# Patient Record
Sex: Male | Born: 1970 | Race: White | Hispanic: No | Marital: Married | Smoking: Former smoker
Health system: Southern US, Community
[De-identification: ages and names within clinical notes are randomized; demographics above are authoritative.]

## PROBLEM LIST (undated history)

## (undated) DIAGNOSIS — J45909 Unspecified asthma, uncomplicated: Secondary | ICD-10-CM

## (undated) HISTORY — PX: HERNIA REPAIR: SHX51

## (undated) HISTORY — DX: Unspecified asthma, uncomplicated: J45.909

---

## 2011-10-31 ENCOUNTER — Emergency Department
Admission: EM | Admit: 2011-10-31 | Discharge: 2011-10-31 | Disposition: A | Discharge status: Home / Self Care | Payer: BC Managed Care – PPO | Source: Home / Self Care | Attending: Emergency Medicine | Admitting: Emergency Medicine

## 2011-10-31 DIAGNOSIS — K047 Periapical abscess without sinus: Secondary | ICD-10-CM

## 2011-10-31 DIAGNOSIS — K089 Disorder of teeth and supporting structures, unspecified: Secondary | ICD-10-CM

## 2011-10-31 DIAGNOSIS — K0889 Other specified disorders of teeth and supporting structures: Secondary | ICD-10-CM

## 2011-10-31 MED ORDER — PENICILLIN V POTASSIUM 500 MG PO TABS: 500.0000 mg | ORAL_TABLET | Freq: Four times a day (QID) | ORAL | Status: AC

## 2011-10-31 NOTE — ED Notes (Signed)
Pain to front tooth started on Friday

## 2011-10-31 NOTE — ED Provider Notes (Signed)
History     CSN: [id number]  Arrival date & time 10/31/11  1654   First MD Initiated Contact with Patient 10/31/11 1719      Chief Complaint  Patient presents with  . Dental Pain   patient is being seen today on Sunday 10/31/11.  Patient is a 41 y.o. male presenting with tooth pain. The history is provided by the patient.  Dental PainThe primary symptoms include mouth pain (Right upper tooth). Primary symptoms do not include dental injury, oral bleeding, headaches, fever, shortness of breath, sore throat, angioedema or cough. The symptoms began 2 days ago. The symptoms are worsening. The symptoms are new (But he had a similar dental infection years ago, and Pen-Vee K worked great for this. He requests Pen-Vee K. He states he is not requesting any prescription for controlled pain medication.). The symptoms occur constantly.  Additional symptoms include: dental sensitivity to temperature and gum tenderness. Additional symptoms do not include: gum swelling, purulent gums, trismus, facial swelling, trouble swallowing, drooling and ear pain. Medical issues do not include: cancer. Associated medical issues comments: Has not seen the dentist in a few years..   He denies any history of acute injury, but he noted the pain while chewing on food 2 days ago.  History reviewed. No pertinent past medical history.  History reviewed. No pertinent past surgical history.  Family History  Problem Relation Age of Onset  . Cancer Father     History  Substance Use Topics  . Smoking status: Not on file  . Smokeless tobacco: Not on file  . Alcohol Use:       Review of Systems  Constitutional: Negative for fever.  HENT: Negative for ear pain, sore throat, facial swelling, drooling and trouble swallowing.   Respiratory: Negative for cough and shortness of breath.   Neurological: Negative for headaches.  All other systems reviewed and are negative.    Allergies  Shrimp  Home Medications    Current Outpatient Rx  Name Route Sig Dispense Refill  . PENICILLIN V POTASSIUM 500 MG PO TABS Oral Take 1 tablet (500 mg total) by mouth 4 (four) times daily. 40 tablet 0    BP 124/78  Pulse 60  Temp 97.8 F (36.6 C) (Oral)  Resp 18  Ht 6\' 6"  (1.981 m)  Wt 270 lb (122.471 kg)  BMI 31.20 kg/m2  SpO2 97%  Physical Exam  Nursing note and vitals reviewed. Constitutional: He is oriented to person, place, and time. He appears well-developed and well-nourished. No distress.  HENT:  Head: Normocephalic and atraumatic.  Right Ear: Tympanic membrane and ear canal normal.  Left Ear: Tympanic membrane and ear canal normal.  Nose: Nose normal.  Mouth/Throat: Uvula is midline, oropharynx is clear and moist and mucous membranes are normal. Dental caries present. No lacerations.    Eyes: Conjunctivae and EOM are normal. Pupils are equal, round, and reactive to light. No scleral icterus.  Neck: Normal range of motion.  Cardiovascular: Normal rate.   Pulmonary/Chest: Effort normal.  Abdominal: He exhibits no distension.  Musculoskeletal: Normal range of motion.  Lymphadenopathy:    He has no cervical adenopathy.  Neurological: He is alert and oriented to person, place, and time.  Skin: Skin is warm.  Psychiatric: He has a normal mood and affect.    ED Course  Procedures (including critical care time)  Labs Reviewed - No data to display No results found.   1. Pain, dental   2. Abscessed tooth  MDM  For the abscessed tooth/dental pain, prescription for Pen-Vee K 500 mg by mouth 4 times a day.-He states this worked great and the past and he declined any other antibiotic. I explained to him that he needs to see a dentist ASAP, and he'll call tomorrow, Monday, to get an appointment with a dentist this week. He did not request any prescription pain medication. He prefers to continue OTC ibuprofen 600 mg 3 times a day p.c., as that helped somewhat.  We gave him printed sheet  with names and phone numbers of local dentists. I also urged him to establish with a PCP, and names and phone numbers given.        Lajean Manes, MD 10/31/11 [phone]

## 2014-05-31 ENCOUNTER — Telehealth: Payer: Self-pay | Admitting: *Deleted

## 2014-05-31 ENCOUNTER — Emergency Department
Admission: EM | Admit: 2014-05-31 | Discharge: 2014-05-31 | Disposition: A | Discharge status: Home / Self Care | Payer: BLUE CROSS/BLUE SHIELD | Source: Home / Self Care | Attending: Emergency Medicine | Admitting: Emergency Medicine

## 2014-05-31 ENCOUNTER — Encounter: Payer: Self-pay | Admitting: *Deleted

## 2014-05-31 DIAGNOSIS — J4521 Mild intermittent asthma with (acute) exacerbation: Secondary | ICD-10-CM

## 2014-05-31 MED ORDER — AZITHROMYCIN 250 MG PO TABS: ORAL_TABLET | ORAL | Status: DC

## 2014-05-31 MED ORDER — ALBUTEROL SULFATE HFA 108 (90 BASE) MCG/ACT IN AERS: 2.0000 | INHALATION_SPRAY | RESPIRATORY_TRACT | Status: DC | PRN

## 2014-05-31 MED ORDER — BENZONATATE 200 MG PO CAPS: ORAL_CAPSULE | ORAL | Status: DC

## 2014-05-31 MED ORDER — PREDNISONE (PAK) 10 MG PO TABS: ORAL_TABLET | ORAL | Status: DC

## 2014-05-31 MED ORDER — ALBUTEROL SULFATE (2.5 MG/3ML) 0.083% IN NEBU: 2.5000 mg | INHALATION_SOLUTION | RESPIRATORY_TRACT | Status: DC | PRN

## 2014-05-31 NOTE — ED Notes (Signed)
Cough x 2-3 days. HA and chest soreness.

## 2014-05-31 NOTE — ED Provider Notes (Signed)
CSN: [id number]     Arrival date & time 05/31/14  1023 History   First MD Initiated Contact with Patient 05/31/14 1037     Chief Complaint  Patient presents with  . Cough   (Consider location/radiation/quality/duration/timing/severity/associated sxs/prior Treatment) HPI URI HISTORY  Kent Hammond is a 44 y.o. male who complains of onset of chest congestion, cough, wheeze symptoms for 4 days. Primarily chest congestion and rare productive cough. Feels wheezing and chest congestion at night and used his son's albuterol nebulizer and that improved the wheezing. Also some fever chills or night sweats.  +  Fever  +  Nasal congestion minimal  Discolored Post-nasal drainage No sinus pain/pressure No sore throat  +  cough positive wheezing positive chest congestion No hemoptysis No shortness of breath No pleuritic pain  No itchy/red eyes No earache  No nausea No vomiting No abdominal pain No diarrhea  No skin rashes +  Fatigue No myalgias No headache   History reviewed. No pertinent past medical history. History reviewed. No pertinent past surgical history. Family History  Problem Relation Age of Onset  . Cancer Father    History  Substance Use Topics  . Smoking status: Former Games developermoker  . Smokeless tobacco: Never Used  . Alcohol Use: No    Review of Systems  All other systems reviewed and are negative.   Allergies  Shrimp  Home Medications   Prior to Admission medications   Medication Sig Start Date End Date Taking? Authorizing Provider  albuterol (PROVENTIL HFA;VENTOLIN HFA) 108 (90 BASE) MCG/ACT inhaler Inhale 2 puffs into the lungs every 4 (four) hours as needed for wheezing or shortness of breath. 05/31/14   Lajean Manesavid Massey, MD  albuterol (PROVENTIL) (2.5 MG/3ML) 0.083% nebulizer solution Take 3 mLs (2.5 mg total) by nebulization every 4 (four) hours as needed for wheezing. 05/31/14   Lajean Manesavid Massey, MD  azithromycin (ZITHROMAX Z-PAK) 250 MG tablet Take 2 tablets on day  one, then 1 tablet daily on days 2 through 5 05/31/14   Lajean Manesavid Massey, MD  benzonatate (TESSALON) 200 MG capsule Take 1 every 8 hours as needed for cough. 05/31/14   Lajean Manesavid Massey, MD  predniSONE (STERAPRED UNI-PAK) 10 MG tablet Take as directed for 6 days.--Take 6 on day 1, 5 on day 2, 4 on day 3, then 3 tablets on day 4, then 2 tablets on day 5, then 1 on day 6. 05/31/14   Lajean Manesavid Massey, MD   BP 112/71 mmHg  Pulse 63  Temp(Src) 98.2 F (36.8 C) (Oral)  Resp 16  Wt 274 lb (124.286 kg)  SpO2 96% Physical Exam  Constitutional: He is oriented to person, place, and time. He appears well-developed and well-nourished. No distress.  HENT:  Head: Normocephalic and atraumatic.  Right Ear: Tympanic membrane normal.  Left Ear: Tympanic membrane normal.  Nose: Nose normal.  Mouth/Throat: Oropharynx is clear and moist. No oropharyngeal exudate.  Eyes: Right eye exhibits no discharge. Left eye exhibits no discharge. No scleral icterus.  Neck: Neck supple.  Cardiovascular: Normal rate, regular rhythm and normal heart sounds.   Pulmonary/Chest: No respiratory distress. He has wheezes (Mild late expiratory wheezes throughout). He has rhonchi. He has no rales.  Lymphadenopathy:    He has no cervical adenopathy.  Neurological: He is alert and oriented to person, place, and time.  Skin: Skin is warm and dry.  Nursing note and vitals reviewed.   ED Course  Procedures (including critical care time) Labs Review Labs Reviewed - No data to display  Imaging Review No results found.   MDM   1. Asthmatic bronchitis, mild intermittent, with acute exacerbation   Treatment options discussed, as well as risks, benefits, alternatives. Patient voiced understanding and agreement with the following plans:   albuterol (PROVENTIL HFA;VENTOLIN HFA) 108 (90 BASE) MCG/ACT inhaler Inhale 2 puffs into the lungs every 4 (four) hours as needed for wheezing or shortness of breath. 18 g Lajean Manes, MD    albuterol  (PROVENTIL) (2.5 MG/3ML) 0.083% nebulizer solution Take 3 mLs (2.5 mg total) by nebulization every 4 (four) hours as needed for wheezing. 75 mL Lajean Manes, MD   azithromycin (ZITHROMAX Z-PAK) 250 MG tablet Take 2 tablets on day one, then 1 tablet daily on days 2 through 5 1 each Lajean Manes, MD   benzonatate (TESSALON) 200 MG capsule Take 1 every 8 hours as needed for cough. 20 capsule Lajean Manes, MD   predniSONE (STERAPRED UNI-PAK) 10 MG tablet Take as directed for 6 days.--Take 6 on day 1, 5 on day 2, 4 on day 3, then 3 tablets on day 4, then 2 tablets on day 5, then 1 on day 6. 21 tablet Lajean Manes, MD    Other symptomatic care discussed . Follow-up with your primary care doctor in 5-7 days if not improving, or sooner if symptoms become worse. Precautions discussed. Red flags discussed. Questions invited and answered. Patient voiced understanding and agreement.     Lajean Manes, MD 05/31/14 [phone]

## 2014-06-17 ENCOUNTER — Ambulatory Visit (INDEPENDENT_AMBULATORY_CARE_PROVIDER_SITE_OTHER): Payer: BLUE CROSS/BLUE SHIELD | Admitting: Family Medicine

## 2014-06-17 ENCOUNTER — Other Ambulatory Visit: Payer: Self-pay | Admitting: Family Medicine

## 2014-06-17 ENCOUNTER — Encounter: Payer: Self-pay | Admitting: Family Medicine

## 2014-06-17 VITALS — BP 138/90 | HR 60 | Ht 78.0 in | Wt 276.0 lb

## 2014-06-17 DIAGNOSIS — J4520 Mild intermittent asthma, uncomplicated: Secondary | ICD-10-CM | POA: Diagnosis not present

## 2014-06-17 DIAGNOSIS — L819 Disorder of pigmentation, unspecified: Secondary | ICD-10-CM | POA: Diagnosis not present

## 2014-06-17 DIAGNOSIS — J45909 Unspecified asthma, uncomplicated: Secondary | ICD-10-CM | POA: Insufficient documentation

## 2014-06-17 DIAGNOSIS — L821 Other seborrheic keratosis: Secondary | ICD-10-CM

## 2014-06-17 NOTE — Progress Notes (Signed)
CC: Kent Hammond is a 44 y.o. male is here for Establish Care   Subjective: HPI:  Very pleasant 44 year old here to establish care  Complains of a growth on the right upper chest has been present for a few years. It seems to be enlarging on a yearly basis. He occasionally gets irritated if friction is applied to it however otherwise is painless. It's been growing black lately and he and his wife are worried that something serious. No family history of skin cancers he's never had anything that required biopsy in the past. Denies unintentional weight loss fevers chills or interventions on the lesion as of yet. It's been there at least 2 or 3 years. He also has some growths on his back that are occasionally itchy that has been there for many years and are not getting bigger or worse or smaller.  History of asthmatic bronchitis. He gets a flare once every 3 years. He describes this as shortness of breath, wheezing and nonproductive cough. This recent flare was in February and responded nicely to prednisone. He never had use albuterol. Symptoms seem to be worse with sudden fluctuations of outdoor temperature. Currently he denies any cough shortness of breath wheezing or chest discomfort   Review Of Systems Outlined In HPI  Past Medical History  Diagnosis Date  . Asthmatic bronchitis     History reviewed. No pertinent past surgical history. Family History  Problem Relation Age of Onset  . Cancer Father   . Alcoholism    . Heart attack      uncles  . Hyperlipidemia Mother     History   Social History  . Marital Status: Married    Spouse Name: N/A  . Number of Children: N/A  . Years of Education: N/A   Occupational History  . Not on file.   Social History Main Topics  . Smoking status: Former Smoker    Quit date: 10/16/2005  . Smokeless tobacco: Current User    Types: Snuff  . Alcohol Use: 0.6 oz/week    1 Standard drinks or equivalent per week  . Drug Use: No  . Sexual  Activity:    Partners: Female   Other Topics Concern  . Not on file   Social History Narrative     Objective: BP 138/90 mmHg  Pulse 60  Ht  (1.981 m)  Wt 276 lb (125.193 kg)  BMI 31.90 kg/m2  General: Alert and Oriented, No Acute Distress HEENT: Pupils equal, round, reactive to light. Conjunctivae clear.  Moist mucous membranes Lungs: Clear comfortable work of breathing Cardiac: Regular rate and rhythm. Extremities: No peripheral edema.  Strong peripheral pulses.  Mental Status: No depression, anxiety, nor agitation. Skin: Warm and dry. Pedunculated fleshy and black mass on the right chest overlying the right clavicle. This lesion is approximately 0.4 cm in diameter. The stalk is completely flesh-colored without any pigmentation. Noninflamed seborrheic keratoses on the back  Assessment & Plan: Kayla was seen today for establish care.  Diagnoses and all orders for this visit:  Asthma, chronic, mild intermittent, uncomplicated Orders: -     Dermatology pathology  Pigmented skin lesion  Seborrheic keratoses   Reassurance provided regarding the benign nature of seborrheic keratoses and that the only reason that we would ever want to take these off this if they're causing him pain. He understandably does not want anything done on them today. Pigmented skin lesion of the right chest: This was removed today with a shave biopsy and sent  to pathology given the black pigmentation scattered through the mass.  Asthma: Controlled no need for interventions at this time.   Shave Biopsy Procedure Note  Pre-operative Diagnosis: Suspicious lesion  Post-operative Diagnosis: same  Locations:overlying right clavicle  Indications: Rule out melanoma  Anesthesia: None  Procedure Details   Patient informed of the risks (including bleeding and infection) and benefits of the  procedure and Verbal informed consent obtained.  The lesion and surrounding area were given a sterile  prep using chlorhexidine and draped in the usual sterile fashion. A scalpel was used to shave an area of skin approximately 0.2cm by 0.2cm.  Hemostasis achieved with alumuninum chloride. Antibiotic ointment and a sterile dressing applied.  The specimen was sent for pathologic examination. The patient tolerated the procedure well.  EBL: 3 ml  Findings: To path  Condition: Stable  Complications: none.  Plan: 1. Instructed to keep the wound dry and covered for 24-48h and clean thereafter. 2. Warning signs of infection were reviewed.   3. Recommended that the patient use OTC analgesics as needed for pain.  4. Return PRN  Return in about 6 months (around 12/18/2014).

## 2016-06-01 ENCOUNTER — Ambulatory Visit: Payer: BLUE CROSS/BLUE SHIELD | Admitting: Osteopathic Medicine

## 2016-06-03 ENCOUNTER — Ambulatory Visit (INDEPENDENT_AMBULATORY_CARE_PROVIDER_SITE_OTHER): Payer: BLUE CROSS/BLUE SHIELD | Admitting: Osteopathic Medicine

## 2016-06-03 ENCOUNTER — Encounter: Payer: Self-pay | Admitting: Osteopathic Medicine

## 2016-06-03 ENCOUNTER — Ambulatory Visit (INDEPENDENT_AMBULATORY_CARE_PROVIDER_SITE_OTHER): Payer: BLUE CROSS/BLUE SHIELD

## 2016-06-03 VITALS — BP 139/83 | HR 66 | Ht 78.0 in | Wt 303.0 lb

## 2016-06-03 DIAGNOSIS — R7301 Impaired fasting glucose: Secondary | ICD-10-CM

## 2016-06-03 DIAGNOSIS — M25571 Pain in right ankle and joints of right foot: Secondary | ICD-10-CM

## 2016-06-03 DIAGNOSIS — E669 Obesity, unspecified: Secondary | ICD-10-CM

## 2016-06-03 DIAGNOSIS — Z13220 Encounter for screening for lipoid disorders: Secondary | ICD-10-CM

## 2016-06-03 DIAGNOSIS — M7751 Other enthesopathy of right foot: Secondary | ICD-10-CM | POA: Diagnosis not present

## 2016-06-03 DIAGNOSIS — K429 Umbilical hernia without obstruction or gangrene: Secondary | ICD-10-CM

## 2016-06-03 DIAGNOSIS — L723 Sebaceous cyst: Secondary | ICD-10-CM

## 2016-06-03 NOTE — Patient Instructions (Signed)
Weight loss: important things to remember  It is hard work! You will have setbacks, but don't get discouraged. The goal is not short-term success, it is long-term health.   Looking at the numbers is important to track your progress and set goals, but how you are feeling and your overall health are the most important things! BMI and pounds and calories and miles logged aren't everything - they are tools to help us reach your goals.  You can do this!!!   Things to remember for exercise for weight loss:   Please note - I am not a certified personal trainer. I can present you with ideas and general workout goals, but an exercise program is largely up to you. Find something you can stick with, and something you enjoy!   As you progress in your exercise regimen think about gradually increasing the following, week by week:   intensity (how strenuous is your workout)  frequency (how often you are exercising)  duration (how many minutes at a time you are exercising)  Walking for 20 minutes a day is certainly better than nothing, but more strenuous exercise will develop better cardiovascular fitness.   interval training (high-intensity alternating with low-intensity, think walk/jog rather than just walk)  muscle strengthening exercises (weight lifting, calisthenics, yoga) - this also helps prevent osteoporosis!   Things to remember for diet changes for weight loss:   Please note - I am not a certified dietician. I can present you with ideas and general diet goals, but a meal plan is largely up to you. I am happy to refer you to a dietician who can give you a detailed meal plan.  Apps/logs are crucial to track how you're eating! It's not realistic to be logging everything you eat forever, but when you're starting a healthy eating lifestyle it's very helpful, and checking in with logs now and then helps you stick to your program!   Calorie restriction with the goal weight loss of no more than one  to one and a half pounds per week.   Increase lean protein such as chicken, fish, turkey.   Decrease fatty foods such as dairy, butter.   Decrease sugary foods. Avoid sugary drinks such as soda or juice.  Increase fiber found in fruit and vegetables.   

## 2016-06-03 NOTE — Progress Notes (Signed)
HPI: Kent Hammond is a 46 y.o. male  who presents to [facility] today, 06/03/16,  for chief complaint of:  Chief Complaint  Patient presents with  . Other    SWITCH FROM HOMMEL    Umbilical problem: Bellybutton is sore when touches it. There is some tissue that seems to be pushing out of it. Typically nonpainful.  Right ankle pain: Lateral below fibula, anterior to the calcaneus, bothers him when he walks too much, isn't really able to run. Otherwise, doesn't typically causing too much trouble. Occasionally will swell. Patient is not currently on or attempting to start any exercise program. History of several sprains to this ankle, most recently about 1 year ago  Skin concern: Left chest there is a lump, was previously told by PCP that this was nothing to worry about. Nonpainful, doesn't bother him, no overlying skin changes.  Obesity: Patient has made some dietary modifications to try to lose weight, has some questions about this.   Past medical history, surgical history, social history and family history reviewed.  Patient Active Problem List   Diagnosis Date Noted  . Asthma, chronic 06/17/2014  . Pigmented skin lesion 06/17/2014  . Seborrheic keratoses 06/17/2014    Current medication list and allergy/intolerance information reviewed.   Current Outpatient Prescriptions on File Prior to Visit  Medication Sig Dispense Refill  . cetirizine (ZYRTEC) 10 MG tablet Take 10 mg by mouth daily.     No current facility-administered medications on file prior to visit.    Allergies  Allergen Reactions  . Shrimp [Shellfish Allergy]       Review of Systems:  Constitutional: No recent illness  HEENT: No  headache, no vision change  Cardiac: No  chest pain, No  pressure, No palpitations  Respiratory:  No  shortness of breath. No  Cough  Gastrointestinal: No  abdominal pain, umbilical discomfort as per HPI, no change on bowel  habits  Musculoskeletal: +new myalgia/arthralgia  Skin: No  Rash, +lump as per HPI  Hem/Onc: No  easy bruising/bleeding, No  abnormal lumps/bumps  Neurologic: No  weakness, No  Dizziness  Psychiatric: No  concerns with depression, No  concerns with anxiety  Exam:  BP 139/83   Pulse 66   Ht 6\' 6"  (1.981 m)   Wt (!) 303 lb (137.4 kg)   BMI 35.02 kg/m   Constitutional: VS see above. General Appearance: alert, well-developed, well-nourished, NAD  Eyes: Normal lids and conjunctive, non-icteric sclera  Ears, Nose, Mouth, Throat: MMM, Normal external inspection ears/nares/mouth/lips/gums.  Neck: No masses, trachea midline.   Respiratory: Normal respiratory effort. no wheeze, no rhonchi, no rales  Cardiovascular: S1/S2 normal, no murmur, no rub/gallop auscultated. RRR.   Musculoskeletal: Gait normal. Symmetric and independent movement of all extremities. No pain/tenderness on ankle eversion/inversion/drawer test. Normal dorsiflexion and plantar flexion. No swelling/ecchymoses.  Neurological: Normal balance/coordination. No tremor.  Skin: warm, dry, intact. Mobile nontender mass on left chest subcutaneous, about the size of a marble, no overlying skin changes,  Psychiatric: Normal judgment/insight. Normal mood and affect. Oriented x3.    Last labs on file from [facility] in 2015   ASSESSMENT/PLAN:   Umbilical hernia without obstruction and without gangrene - Advised patient benign, options for surgical correction, advised weight loss  Right ankle tendonitis - Advised anti-inflammatory use, rest/ice/compression/elevation when it bothers him, could consider walking boot and sports medicine follow-up if no improvement.  - Plan: DG Ankle Complete Right  Sebaceous cyst - Advised benign, it does  not bother patient. Advised we can remove this if desired  Obesity (BMI 30-39.9) - Discussion of lifestyle modifications for healthy weight loss, instructions printed.  Screening, lipid -  Plan: COMPLETE METABOLIC PANEL WITH GFR, Lipid panel       Follow-up plan: Return if symptoms worsen or fail to improve, and for annual physical when due.  Visit summary with medication list and pertinent instructions was printed for patient to review, alert us if any changes needed. All questions at time of visit were answered - patient instructed to contact office with any additional concerns. ER/RTC precautions were reviewed with the patient and understanding verbalized.

## 2016-06-07 LAB — LIPID PANEL
Cholesterol: 199 mg/dL (ref <200)
HDL: 41 mg/dL (ref >40)
LDL CALC: 141 mg/dL — AB (ref <100)
Total CHOL/HDL Ratio: 4.9 Ratio (ref <5.0)
Triglycerides: 85 mg/dL (ref <150)
VLDL: 17 mg/dL (ref <30)

## 2016-06-07 LAB — COMPLETE METABOLIC PANEL WITH GFR
ALBUMIN: 4.5 g/dL (ref 3.6–5.1)
ALT: 26 U/L (ref 9–46)
AST: 18 U/L (ref 10–40)
Alkaline Phosphatase: 115 U/L (ref 40–115)
BUN: 12 mg/dL (ref 7–25)
CO2: 30 mmol/L (ref 20–31)
Calcium: 9.7 mg/dL (ref 8.6–10.3)
Chloride: 105 mmol/L (ref 98–110)
Creat: 0.9 mg/dL (ref 0.60–1.35)
GFR, Est Non African American: >89 mL/min (ref >=60)
GLUCOSE: 102 mg/dL — AB (ref 65–99)
Potassium: 5.2 mmol/L (ref 3.5–5.3)
Sodium: 142 mmol/L (ref 135–146)
Total Bilirubin: 0.8 mg/dL (ref 0.2–1.2)
Total Protein: 7 g/dL (ref 6.1–8.1)

## 2016-06-09 NOTE — Addendum Note (Signed)
Addended by: Deirdre PippinsALEXANDER, Demontae Antunes M on: 06/09/2016 09:52 AM   Modules accepted: Orders

## 2016-06-11 LAB — HEMOGLOBIN A1C

## 2016-08-16 ENCOUNTER — Ambulatory Visit (INDEPENDENT_AMBULATORY_CARE_PROVIDER_SITE_OTHER): Payer: BLUE CROSS/BLUE SHIELD | Admitting: Osteopathic Medicine

## 2016-08-16 ENCOUNTER — Encounter: Payer: Self-pay | Admitting: Osteopathic Medicine

## 2016-08-16 VITALS — BP 140/75 | HR 58 | Wt 300.0 lb

## 2016-08-16 DIAGNOSIS — R7301 Impaired fasting glucose: Secondary | ICD-10-CM

## 2016-08-16 DIAGNOSIS — E669 Obesity, unspecified: Secondary | ICD-10-CM

## 2016-08-16 LAB — POCT GLYCOSYLATED HEMOGLOBIN (HGB A1C): HEMOGLOBIN A1C: 5.4

## 2016-08-16 NOTE — Progress Notes (Signed)
HPI: Kent Hammond is a 46 y.o. male  who [facility]  today, 08/16/16,  for chief complaint of:  Chief Complaint  Patient presents with  . f/u blood sugars    check A1c since unable to add to labs     A1C 5.4 - Slightly elevated fasting glucose on routine labs prompted further evaluation. Patient has already made some dietary modifications and exercise regimen changes. Doing well since last visit  Past medical history, surgical history, social history and family history reviewed.  Patient Active Problem List   Diagnosis Date Noted  . Asthma, chronic 06/17/2014  . Pigmented skin lesion 06/17/2014  . Seborrheic keratoses 06/17/2014    Current medication list and allergy/intolerance information reviewed.   No current outpatient prescriptions on file prior to visit.   No current facility-administered medications on file prior to visit.    Allergies  Allergen Reactions  . Shrimp [Shellfish Allergy]       Review of Systems:  Constitutional: No recent illness, feels well today   Endocrine: No polyuria/polydipsia  Exam:  BP 140/75   Pulse (!) 58   Wt 300 lb (136.1 kg)   BMI 34.67 kg/m   Constitutional: VS see above. General Appearance: alert, well-developed, well-nourished, NAD  Psychiatric: Normal judgment/insight. Normal mood and affect. Oriented x3.    Recent Results (from the past 2160 hour(s))  COMPLETE METABOLIC PANEL WITH GFR     Status: Abnormal   Collection Time: 06/07/16  8:05 AM  Result Value Ref Range   Sodium 142 135 - 146 mmol/L   Potassium 5.2 3.5 - 5.3 mmol/L   Chloride 105 98 - 110 mmol/L   CO2 30 20 - 31 mmol/L   Glucose, Bld 102 (H) 65 - 99 mg/dL   BUN 12 7 - 25 mg/dL   Creat 8.650.90 7.840.60 - 6.961.35 mg/dL   Total Bilirubin 0.8 0.2 - 1.2 mg/dL   Alkaline Phosphatase 115 40 - 115 U/L   AST 18 10 - 40 U/L   ALT 26 9 - 46 U/L   Total Protein 7.0 6.1 - 8.1 g/dL   Albumin 4.5 3.6 - 5.1 g/dL   Calcium 9.7  8.6 - 29.510.3 mg/dL   GFR, Est African American >89 >=60 mL/min   GFR, Est Non African American >89 >=60 mL/min  Lipid panel     Status: Abnormal   Collection Time: 06/07/16  8:05 AM  Result Value Ref Range   Cholesterol 199 <200 mg/dL   Triglycerides 85 <284<150 mg/dL   HDL 41 >13>40 mg/dL   Total CHOL/HDL Ratio 4.9 <5.0 Ratio   VLDL 17 <30 mg/dL   LDL Cholesterol 244141 (H) <100 mg/dL  Hemoglobin [id number]     Status: None   Collection Time: 06/09/16  3:52 AM  Result Value Ref Range   Hgb A1c MFr Bld CANCELED <5.7 %    Comment: Test not performed, no Lavender was received.    Result canceled by the ancillary    Mean Plasma Glucose CANCELED mg/dL    Comment: Result canceled by the ancillary   Results for orders placed or performed in visit on 08/16/16 (from the past 24 hour(s))  POCT HgB A1C     Status: Abnormal   Collection Time: 08/16/16  9:35 AM  Result Value Ref Range   Hemoglobin A1C 5.4        ASSESSMENT/PLAN:   Doing well, reviewed all lab results as noted above.  Elevated fasting glucose - Plan:  POCT HgB A1C  Obesity (BMI 30-39.9)     Follow-up plan: Return for Recheck labs in 6-12 months, annual physical in 12 months.   All questions at time of visit were answered - patient instructed to contact office with any additional concerns. ER/RTC precautions were reviewed with the patient and understanding verbalized.   Note: Total time spent 10 minutes, greater than 50% of the visit was spent face-to-face counseling and coordinating care for the following: The primary encounter diagnosis was Elevated fasting glucose. A diagnosis of Obesity (BMI 30-39.9) was also pertinent to this visit.Marland Kitchen

## 2017-03-19 ENCOUNTER — Encounter: Payer: Self-pay | Admitting: Emergency Medicine

## 2017-03-19 ENCOUNTER — Emergency Department
Admission: EM | Admit: 2017-03-19 | Discharge: 2017-03-19 | Disposition: A | Discharge status: Home / Self Care | Payer: BLUE CROSS/BLUE SHIELD | Source: Home / Self Care | Attending: Family Medicine | Admitting: Family Medicine

## 2017-03-19 DIAGNOSIS — R519 Headache, unspecified: Secondary | ICD-10-CM

## 2017-03-19 DIAGNOSIS — R51 Headache: Secondary | ICD-10-CM

## 2017-03-19 NOTE — Discharge Instructions (Signed)
° °  You may take 500mg  acetaminophen every 4-6 hours or in combination with ibuprofen 400-600mg  every 6-8 hours as needed for pain and inflammation.   Be sure to drink at least eight 8oz glasses of water to stay well hydrated and get at least 8 hours of sleep at night, preferably more while sick or when you get a headache.

## 2017-03-19 NOTE — ED Triage Notes (Signed)
Patient complaining of severe headache last night, unable to go to work, nausea, took some Advil with no relief.  Patient feels better today.

## 2017-03-19 NOTE — ED Provider Notes (Signed)
[facility]    CSN: [id number] Arrival date & time: 03/19/17  1100     History   Chief Complaint Chief Complaint  Patient presents with  . Headache    HPI Kent Hammond is a 46 y.o. male.   HPI  Kent Hammond is a 46 y.o. 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 He had to call out sick.  Denies change in vision but did have some nausea with it.  Denies weakness or numbness in arms or legs.  Pain was moderate to severe, radiating to Right side of neck.  Pain has nearly resolved after taking Advil and going sleeping last night.  He is requesting a work note today.  Denies hx of migraines or severe headaches. No recent head trauma, decreased sleep, illness, or increased stress.  He does report shoveling snow a few days ago and has some Left sided neck/shoulder stiffness but not on the Right side. Ice and heat have helped with the muscle soreness.    Past Medical History:  Diagnosis Date  . Asthmatic bronchitis     Patient Active Problem List   Diagnosis Date Noted  . Asthma, chronic 06/17/2014  . Pigmented skin lesion 06/17/2014  . Seborrheic keratoses 06/17/2014    History reviewed. No pertinent surgical history.     Home Medications    Prior to Admission medications   Not on File    Family History Family History  Problem Relation Age of Onset  . Cancer Father   . Alcoholism Unknown   . Heart attack Unknown        uncles  . Hyperlipidemia Mother     Social History Social History   Tobacco Use  . Smoking status: Former Smoker    Last attempt to quit: 10/16/2005    Years since quitting: 11.4  . Smokeless tobacco: Current User    Types: Snuff  Substance Use Topics  . Alcohol use: Yes    Alcohol/week: 0.6 oz    Types: 1 Standard drinks or equivalent per week  . Drug use: No     Allergies   Shrimp [shellfish allergy]   Review of Systems Review  of Systems  Constitutional: Negative for chills and fever.  Eyes: Negative for photophobia, pain and visual disturbance.  Gastrointestinal: Positive for nausea. Negative for diarrhea and vomiting.  Musculoskeletal: Positive for back pain (Left upper), myalgias, neck pain and neck stiffness (Left side, has since resolved).  Skin: Negative for rash and wound.  Neurological: Positive for headaches. Negative for dizziness, tremors, seizures, syncope, facial asymmetry, speech difficulty, weakness, light-headedness and numbness.     Physical Exam Triage Vital Signs ED Triage Vitals [03/19/17 1127]  Enc Vitals Group     BP 138/90     Pulse Rate 66     Resp      Temp 98.4 F (36.9 C)     Temp Source Oral     SpO2 97 %     Weight (!) 306 lb 8 oz (139 kg)     Height 6\' 6"  (1.981 m)     Head Circumference      Peak Flow      Pain Score 0     Pain Loc      Pain Edu?      Excl. in GC?    No data found.  Updated Vital Signs BP 138/90 (BP Location: Right Arm)  Pulse 66   Temp 98.4 F (36.9 C) (Oral)   Ht 6\' 6"  (1.981 m)   Wt (!) 306 lb 8 oz (139 kg)   SpO2 97%   BMI 35.42 kg/m   Visual Acuity Right Eye Distance:   Left Eye Distance:   Bilateral Distance:    Right Eye Near:   Left Eye Near:    Bilateral Near:     Physical Exam  Constitutional: He is oriented to person, place, and time. He appears well-developed and well-nourished.  Non-toxic appearance. He does not appear ill. No distress.  HENT:  Head: Normocephalic and atraumatic.  Right Ear: Tympanic membrane normal.  Left Ear: Tympanic membrane normal.  Nose: Nose normal. Right sinus exhibits no maxillary sinus tenderness and no frontal sinus tenderness. Left sinus exhibits no maxillary sinus tenderness and no frontal sinus tenderness.  Mouth/Throat: Uvula is midline, oropharynx is clear and moist and mucous membranes are normal.  No tenderness over temporal artery  Eyes: Conjunctivae and EOM are normal. Pupils are  equal, round, and reactive to light. No scleral icterus.  Neck: Normal range of motion.  Cardiovascular: Normal rate, regular rhythm and normal heart sounds.  Pulmonary/Chest: Effort normal and breath sounds normal. No respiratory distress. He has no wheezes. He has no rales. He exhibits no tenderness.  Abdominal: Soft. Bowel sounds are normal. He exhibits no distension and no mass. There is no tenderness. There is no rebound and no guarding.  Musculoskeletal: Normal range of motion.  Neurological: He is alert and oriented to person, place, and time. He has normal strength. No cranial nerve deficit. He displays a negative Romberg sign.  Skin: Skin is warm and dry.  Nursing note and vitals reviewed.    UC Treatments / Results  Labs (all labs ordered are listed, but only abnormal results are displayed) Labs Reviewed - No data to display  EKG  EKG Interpretation None       Radiology No results found.  Procedures Procedures (including critical care time)  Medications Ordered in UC Medications - No data to display   Initial Impression / Assessment and Plan / UC Course  I have reviewed the triage vital signs and the nursing notes.  Pertinent labs & imaging results that were available during my care of the patient were reviewed by me and considered in my medical decision making (see chart for details).     Hx and exam most c/w tension type headache. Reassuring symptoms have nearly resolved this morning and normal neuro exam. Encouraged fluids and rest today. Pt scheduled to return to work on Tuesday, 12/18. Encouraged f/u with PCP Monday or Tuesday if HAs keep returning or worsening.  Discussed symptoms that warrant emergent care in the ED.   Final Clinical Impressions(s) / UC Diagnoses   Final diagnoses:  Right-sided headache    ED Discharge Orders    None       Controlled Substance Prescriptions [place] Controlled Substance Registry consulted? Not Applicable     Rolla Platehelps, Andilyn Bettcher O, PA-C 03/19/17 1239

## 2017-04-23 ENCOUNTER — Emergency Department (INDEPENDENT_AMBULATORY_CARE_PROVIDER_SITE_OTHER): Payer: BLUE CROSS/BLUE SHIELD

## 2017-04-23 ENCOUNTER — Encounter: Payer: Self-pay | Admitting: Emergency Medicine

## 2017-04-23 ENCOUNTER — Emergency Department
Admission: EM | Admit: 2017-04-23 | Discharge: 2017-04-23 | Disposition: A | Discharge status: Home / Self Care | Payer: BLUE CROSS/BLUE SHIELD | Source: Home / Self Care | Attending: Family Medicine | Admitting: Family Medicine

## 2017-04-23 ENCOUNTER — Other Ambulatory Visit: Payer: Self-pay

## 2017-04-23 DIAGNOSIS — R05 Cough: Secondary | ICD-10-CM

## 2017-04-23 DIAGNOSIS — J069 Acute upper respiratory infection, unspecified: Secondary | ICD-10-CM

## 2017-04-23 LAB — POCT INFLUENZA A/B
INFLUENZA A, POC: NEGATIVE
Influenza B, POC: NEGATIVE

## 2017-04-23 MED ORDER — BENZONATATE 200 MG PO CAPS: ORAL_CAPSULE | ORAL | 0 refills | Status: DC

## 2017-04-23 MED ORDER — PREDNISONE 20 MG PO TABS: ORAL_TABLET | ORAL | 0 refills | Status: DC

## 2017-04-23 MED ORDER — AZITHROMYCIN 250 MG PO TABS: ORAL_TABLET | ORAL | 0 refills | Status: DC

## 2017-04-23 MED ORDER — ALBUTEROL SULFATE (2.5 MG/3ML) 0.083% IN NEBU: 2.5000 mg | INHALATION_SOLUTION | Freq: Four times a day (QID) | RESPIRATORY_TRACT | 0 refills | Status: DC | PRN

## 2017-04-23 NOTE — Discharge Instructions (Signed)
Take plain guaifenesin (1200mg  extended release tabs such as Mucinex) twice daily, with plenty of water, for cough and congestion.  May add Pseudoephedrine (30mg , one or two every 4 to 6 hours) for sinus congestion.  Get adequate rest.   May use Afrin nasal spray (or generic oxymetazoline) each morning for about 5 days and then discontinue.  Also recommend using saline nasal spray several times daily and saline nasal irrigation (AYR is a common brand).  Use Flonase nasal spray each morning after using Afrin nasal spray and saline nasal irrigation. Try warm salt water gargles for sore throat.  Stop all antihistamines for now, and other non-prescription cough/cold preparations. Begin albuterol by nebulizer if needed for wheezing and shortness of breath. Begin Tessalon at night if necessary for night cough.

## 2017-04-23 NOTE — ED Triage Notes (Signed)
Reports 3 days of congeston, tinnitus, cough; took NyQuil 0300.

## 2017-04-23 NOTE — ED Provider Notes (Signed)
[name]    CSN: [id number] Arrival date & time: 04/23/17  1036     History   Chief Complaint Chief Complaint  Patient presents with  . Nasal Congestion  . Cough  . Tinnitus    HPI Kent Hammond is a 47 y.o. male.   Patient complains of five day history of typical cold-like symptoms developing over several days, including mild sore throat, sinus congestion, hoarse, fatigue, and cough.  This morning he developed tightness in his anterior chest, ear pressure, and tinnitus.  He has a past history of asthma as infant, seasonal rhinitis, and pneumonia two years ago.   The history is provided by the patient.    Past Medical History:  Diagnosis Date  . Asthmatic bronchitis     Patient Active Problem List   Diagnosis Date Noted  . Asthma, chronic 06/17/2014  . Pigmented skin lesion 06/17/2014  . Seborrheic keratoses 06/17/2014    History reviewed. No pertinent surgical history.     Home Medications    Prior to Admission medications   Medication Sig Start Date End Date Taking? Authorizing Provider  albuterol (PROVENTIL) (2.5 MG/3ML) 0.083% nebulizer solution Take 3 mLs (2.5 mg total) by nebulization every 6 (six) hours as needed for wheezing or shortness of breath. Max 4 doses per day 04/23/17   Kent Haw, MD  azithromycin (ZITHROMAX Z-PAK) 250 MG tablet Take 2 tabs today; then begin one tab once daily for 4 more days. 04/23/17   Kent Haw, MD  benzonatate (TESSALON) 200 MG capsule Take one cap by mouth at bedtime as needed for cough.  May repeat in 4 to 6 hours 04/23/17   Kent Haw, MD  predniSONE (DELTASONE) 20 MG tablet Take one tab by mouth twice daily for 4 days, then one daily for 3 days. Take with food. 04/23/17   Kent Haw, MD    Family History Family History  Problem Relation Age of Onset  . Cancer Father   . Alcoholism Unknown   . Heart attack Unknown        uncles  . Hyperlipidemia Mother     Social  History Social History   Tobacco Use  . Smoking status: Former Smoker    Last attempt to quit: 10/16/2005    Years since quitting: 11.5  . Smokeless tobacco: Current User    Types: Snuff  Substance Use Topics  . Alcohol use: Yes    Alcohol/week: 0.6 oz    Types: 1 Standard drinks or equivalent per week  . Drug use: No     Allergies   Shrimp [shellfish allergy]   Review of Systems Review of Systems + sore throat + cough + hoarse + sneezing No pleuritic pain No wheezing + nasal congestion + post-nasal drainage No sinus pain/pressure No itchy/red eyes ? Earache + tinnitus No hemoptysis No SOB No fever/chills No nausea No vomiting No abdominal pain No diarrhea No urinary symptoms No skin rash + fatigue No myalgias + headache Used OTC meds without relief   Physical Exam Triage Vital Signs ED Triage Vitals  Enc Vitals Group     BP 04/23/17 1113 (!) 143/83     Pulse Rate 04/23/17 1113 78     Resp 04/23/17 1113 18     Temp 04/23/17 1113 97.8 F (36.6 C)     Temp Source 04/23/17 1113 Oral     SpO2 04/23/17 1113 97 %     Weight 04/23/17 1114 (!) 303 lb (137.4  kg)     Height 04/23/17 1114 6\' 6"  (1.981 m)     Head Circumference --      Peak Flow --      Pain Score --      Pain Loc --      Pain Edu? --      Excl. in GC? --    No data found.  Updated Vital Signs BP (!) 143/83 (BP Location: Right Arm)   Pulse 78   Temp 97.8 F (36.6 C) (Oral)   Resp 18   Ht 6\' 6"  (1.981 m)   Wt (!) 303 lb (137.4 kg)   SpO2 97%   BMI 35.02 kg/m   Visual Acuity Right Eye Distance:   Left Eye Distance:   Bilateral Distance:    Right Eye Near:   Left Eye Near:    Bilateral Near:     Physical Exam Nursing notes and Vital Signs reviewed. Appearance:  Patient appears stated age, and in no acute distress Eyes:  Pupils are equal, round, and reactive to light and accomodation.  Extraocular movement is intact.  Conjunctivae are not inflamed  Ears:  Canals normal.   Tympanic membranes normal.  Nose:  Mildly congested turbinates.  No sinus tenderness.  Pharynx:  Normal Neck:  Supple.  Enlarged posterior/lateral nodes are palpated bilaterally, tender to palpation on the left.   Lungs:  Clear to auscultation.  Breath sounds are equal.  Moving air well. Heart:  Regular rate and rhythm without murmurs, rubs, or gallops.  Abdomen:  Nontender without masses or hepatosplenomegaly.  Bowel sounds are present.  No CVA or flank tenderness.  Extremities:  No edema.  Skin:  No rash present.    UC Treatments / Results  Labs (all labs ordered are listed, but only abnormal results are displayed) Labs Reviewed  POCT INFLUENZA A/B negative    EKG  EKG Interpretation None       Radiology Dg Chest 2 View  Result Date: 04/23/2017 CLINICAL DATA:  Cough and congestion for several days EXAM: CHEST  2 VIEW COMPARISON:  None. FINDINGS: There is no appreciable edema or consolidation. Heart size and pulmonary vascularity are normal. No adenopathy. No pneumothorax. No bone lesions. IMPRESSION: No edema or consolidation. Electronically Signed   By: Bretta Bang III M.D.   On: 04/23/2017 11:46    Procedures Procedures (including critical care time)  Medications Ordered in UC Medications - No data to display   Initial Impression / Assessment and Plan / UC Course  I have reviewed the triage vital signs and the nursing notes.  Pertinent labs & imaging results that were available during my care of the patient were reviewed by me and considered in my medical decision making (see chart for details).    Begin Z-pak for atypical coverage, and prednisone burst/taper. Prescription written for Benzonatate [facility]) to take at bedtime for night-time cough.  Rx for albuterol solution for nebulizer if needed. Take plain guaifenesin (1200mg  extended release tabs such as Mucinex) twice daily, with plenty of water, for cough and congestion.  May add Pseudoephedrine (30mg , one  or two every 4 to 6 hours) for sinus congestion.  Get adequate rest.   May use Afrin nasal spray (or generic oxymetazoline) each morning for about 5 days and then discontinue.  Also recommend using saline nasal spray several times daily and saline nasal irrigation (AYR is a common brand).  Use Flonase nasal spray each morning after using Afrin nasal spray and saline nasal irrigation.  Try warm salt water gargles for sore throat.  Stop all antihistamines for now, and other non-prescription cough/cold preparations. Begin albuterol by nebulizer if needed for wheezing and shortness of breath. Begin Tessalon at night if necessary for night cough. Followup with Family Doctor if not improved in about 10 days.    Final Clinical Impressions(s) / UC Diagnoses   Final diagnoses:  Upper respiratory tract infection, unspecified type    ED Discharge Orders        Ordered    azithromycin (ZITHROMAX Z-PAK) 250 MG tablet     04/23/17 1208    predniSONE (DELTASONE) 20 MG tablet     04/23/17 1208    benzonatate (TESSALON) 200 MG capsule     04/23/17 1209    albuterol (PROVENTIL) (2.5 MG/3ML) 0.083% nebulizer solution  Every 6 hours PRN     04/23/17 1209          Kent HawBeese, Frady Taddeo A, MD 04/29/17 1459

## 2017-05-13 ENCOUNTER — Telehealth: Payer: Self-pay | Admitting: Physician Assistant

## 2017-05-13 DIAGNOSIS — Z20828 Contact with and (suspected) exposure to other viral communicable diseases: Secondary | ICD-10-CM

## 2017-05-13 MED ORDER — OSELTAMIVIR PHOSPHATE 75 MG PO CAPS: 75.0000 mg | ORAL_CAPSULE | Freq: Every day | ORAL | 0 refills | Status: AC

## 2017-05-13 NOTE — Telephone Encounter (Signed)
Daughter diagnosed with influenza A in office today He is a household contact Lab Results  Component Value Date   CREATININE 0.90 06/07/2016   BUN 12 06/07/2016   NA 142 06/07/2016   K 5.2 06/07/2016   CL 105 06/07/2016   CO2 30 06/07/2016   1. Exposure to the flu - no hx of renal impairment - oseltamivir (TAMIFLU) 75 MG capsule; Take 1 capsule (75 mg total) by mouth daily for 10 days.  Dispense: 10 capsule; Refill: 0

## 2017-08-15 ENCOUNTER — Encounter: Payer: BLUE CROSS/BLUE SHIELD | Admitting: Osteopathic Medicine

## 2017-09-08 ENCOUNTER — Encounter: Payer: Self-pay | Admitting: Emergency Medicine

## 2017-09-08 ENCOUNTER — Emergency Department
Admission: EM | Admit: 2017-09-08 | Discharge: 2017-09-08 | Disposition: A | Discharge status: Home / Self Care | Payer: BLUE CROSS/BLUE SHIELD | Source: Home / Self Care | Attending: Family Medicine | Admitting: Family Medicine

## 2017-09-08 ENCOUNTER — Other Ambulatory Visit: Payer: Self-pay

## 2017-09-08 DIAGNOSIS — K0889 Other specified disorders of teeth and supporting structures: Secondary | ICD-10-CM

## 2017-09-08 DIAGNOSIS — J340 Abscess, furuncle and carbuncle of nose: Secondary | ICD-10-CM

## 2017-09-08 MED ORDER — AMOXICILLIN-POT CLAVULANATE 875-125 MG PO TABS: 1.0000 | ORAL_TABLET | Freq: Two times a day (BID) | ORAL | 0 refills | Status: DC

## 2017-09-08 NOTE — ED Triage Notes (Signed)
Upper Front Left tooth pain x 3 days

## 2017-09-08 NOTE — ED Provider Notes (Signed)
[facility]    CSN: [id number] Arrival date & time: 09/08/17  1028     History   Chief Complaint Chief Complaint  Patient presents with  . Dental Pain    HPI Kent Hammond is a 47 y.o. male.   HPI  Kent Hammond is a 47 y.o. male presenting to UC with c/o 3 days of gradually worsening dental pain that is aching and throbbing. Pt has partial dentures in the location of pain but notes he did chip one of his partials 2 weeks ago and wonders if that could have started everything. He is having soreness and redness on the Left side of his nose above where the tooth pain is. No fever or chills. He does have a dentist but knows they would direct him to UC first to be treated for infection before dental work is done.   Past Medical History:  Diagnosis Date  . Asthmatic bronchitis     Patient Active Problem List   Diagnosis Date Noted  . Asthma, chronic 06/17/2014  . Pigmented skin lesion 06/17/2014  . Seborrheic keratoses 06/17/2014    History reviewed. No pertinent surgical history.     Home Medications    Prior to Admission medications   Medication Sig Start Date End Date Taking? Authorizing Provider  amoxicillin-clavulanate (AUGMENTIN) 875-125 MG tablet Take 1 tablet by mouth 2 (two) times daily. One po bid x 7 days 09/08/17   Lurene Shadow, PA-C    Family History Family History  Problem Relation Age of Onset  . Cancer Father   . Alcoholism Unknown   . Heart attack Unknown        uncles  . Hyperlipidemia Mother     Social History Social History   Tobacco Use  . Smoking status: Former Smoker    Last attempt to quit: 10/16/2005    Years since quitting: 11.9  . Smokeless tobacco: Current User    Types: Snuff  Substance Use Topics  . Alcohol use: Yes    Alcohol/week: 0.6 oz    Types: 1 Standard drinks or equivalent per week  . Drug use: No     Allergies   Shrimp [shellfish allergy]   Review of Systems Review of Systems    Constitutional: Negative for chills and fever.  HENT: Positive for dental problem. Negative for congestion, sinus pressure, sinus pain, sore throat and trouble swallowing.   Skin: Positive for color change. Negative for wound.     Physical Exam Triage Vital Signs ED Triage Vitals  Enc Vitals Group     BP 09/08/17 1051 133/86     Pulse Rate 09/08/17 1051 62     Resp --      Temp 09/08/17 1051 (!) 97.5 F (36.4 C)     Temp Source 09/08/17 1051 Oral     SpO2 09/08/17 1051 96 %     Weight 09/08/17 1052 (!) 313 lb (142 kg)     Height 09/08/17 1052 6\' 6"  (1.981 m)     Head Circumference --      Peak Flow --      Pain Score 09/08/17 1052 4     Pain Loc --      Pain Edu? --      Excl. in GC? --    No data found.  Updated Vital Signs BP 133/86 (BP Location: Right Arm)   Pulse 62   Temp (!) 97.5 F (36.4 C) (Oral)   Ht 6\' 6"  (1.981 m)  Wt (!) 313 lb (142 kg)   SpO2 96%   BMI 36.17 kg/m   Visual Acuity Right Eye Distance:   Left Eye Distance:   Bilateral Distance:    Right Eye Near:   Left Eye Near:    Bilateral Near:     Physical Exam  Constitutional: He is oriented to person, place, and time. He appears well-developed and well-nourished.  HENT:  Head: Normocephalic and atraumatic.  Nose: Sinus tenderness present. No mucosal edema. Right sinus exhibits no maxillary sinus tenderness and no frontal sinus tenderness. Left sinus exhibits no maxillary sinus tenderness and no frontal sinus tenderness.    Mouth/Throat: Uvula is midline, oropharynx is clear and moist and mucous membranes are normal.  Tenderness along front upper gingiva on the Left side. No edema, bleeding or discharge.   Eyes: EOM are normal.  Neck: Normal range of motion.  Cardiovascular: Normal rate.  Pulmonary/Chest: Effort normal.  Musculoskeletal: Normal range of motion.  Neurological: He is alert and oriented to person, place, and time.  Skin: Skin is warm and dry.  Psychiatric: He has a normal  mood and affect. His behavior is normal.  Nursing note and vitals reviewed.    UC Treatments / Results  Labs (all labs ordered are listed, but only abnormal results are displayed) Labs Reviewed - No data to display  EKG None  Radiology No results found.  Procedures Procedures (including critical care time)  Medications Ordered in UC Medications - No data to display  Initial Impression / Assessment and Plan / UC Course  I have reviewed the triage vital signs and the nursing notes.  Pertinent labs & imaging results that were available during my care of the patient were reviewed by me and considered in my medical decision making (see chart for details).     Concern for underlying dental abscess and early cellulitis of Left side of nose.  Will start on Augmentin   Final Clinical Impressions(s) / UC Diagnoses   Final diagnoses:  Toothache  Cellulitis of nose     Discharge Instructions      Please take antibiotics as prescribed and be sure to complete entire course even if you start to feel better to ensure infection does not come back.  You may take 500mg  acetaminophen every 4-6 hours or in combination with ibuprofen 400-600mg  every 6-8 hours as needed for pain and inflammation.  Please follow up with your family doctor if your nose keeps becoming more sore and follow up with your dentist for further evaluation of dental pain.      ED Prescriptions    Medication Sig Dispense Auth. Provider   amoxicillin-clavulanate (AUGMENTIN) 875-125 MG tablet Take 1 tablet by mouth 2 (two) times daily. One po bid x 7 days 14 tablet Lurene ShadowPhelps, Anahli Arvanitis O, [place]PA-C     Controlled Substance Prescriptions [place] Controlled Substance Registry consulted? Not Applicable   Rolla Platehelps, Syre Knerr O, PA-C 09/08/17 1105

## 2017-09-08 NOTE — Discharge Instructions (Signed)
°  Please take antibiotics as prescribed and be sure to complete entire course even if you start to feel better to ensure infection does not come back.  You may take 500mg  acetaminophen every 4-6 hours or in combination with ibuprofen 400-600mg  every 6-8 hours as needed for pain and inflammation.  Please follow up with your family doctor if your nose keeps becoming more sore and follow up with your dentist for further evaluation of dental pain.

## 2017-09-19 ENCOUNTER — Encounter: Payer: Self-pay | Admitting: Osteopathic Medicine

## 2017-09-19 ENCOUNTER — Ambulatory Visit (INDEPENDENT_AMBULATORY_CARE_PROVIDER_SITE_OTHER): Payer: BLUE CROSS/BLUE SHIELD | Admitting: Osteopathic Medicine

## 2017-09-19 VITALS — BP 130/80 | HR 66 | Temp 97.9°F | Ht 78.0 in | Wt 311.0 lb

## 2017-09-19 DIAGNOSIS — K429 Umbilical hernia without obstruction or gangrene: Secondary | ICD-10-CM

## 2017-09-19 DIAGNOSIS — Z0000 Encounter for general adult medical examination without abnormal findings: Secondary | ICD-10-CM

## 2017-09-19 DIAGNOSIS — R7301 Impaired fasting glucose: Secondary | ICD-10-CM | POA: Diagnosis not present

## 2017-09-19 DIAGNOSIS — Z23 Encounter for immunization: Secondary | ICD-10-CM | POA: Diagnosis not present

## 2017-09-19 MED ORDER — ALBUTEROL SULFATE HFA 108 (90 BASE) MCG/ACT IN AERS
2.0000 | INHALATION_SPRAY | RESPIRATORY_TRACT | 11 refills | Status: DC | PRN
Start: 2017-09-19 — End: 2018-03-01

## 2017-09-19 NOTE — Progress Notes (Signed)
HPI: Kent Hammond is a 47 y.o. male who  has a past medical history of Asthmatic bronchitis.  he presents to [facility] today, 09/19/17,  for chief complaint of: Annual Physical   Patient here for annual physical / wellness exam.  See preventive care reviewed as below.  Recent labs reviewed   Additional concerns today include:   Intentional weight loss over the past year, overall doing well w/ diet/exercise.   Hx asthma: stable, no problems, hasn't needed meds. Inhaler probably old  Requests referral to gen surg for discussion on possible repair of umbilical hernia   Hx elevated Glc, last A1C was ok   Patient is accompanied by wife who assists with history-taking.   Past medical, surgical, social and family history reviewed:  Patient Active Problem List   Diagnosis Date Noted  . Umbilical hernia without obstruction and without gangrene 09/19/2017  . Asthma, chronic 06/17/2014  . Pigmented skin lesion 06/17/2014  . Seborrheic keratoses 06/17/2014   No past surgical history on file.  Social History   Tobacco Use  . Smoking status: Former Smoker    Last attempt to quit: 10/16/2005    Years since quitting: 11.9  . Smokeless tobacco: Current User    Types: Snuff  Substance Use Topics  . Alcohol use: Yes    Alcohol/week: 0.6 oz    Types: 1 Standard drinks or equivalent per week    Family History  Problem Relation Age of Onset  . Cancer Father        brain cancer, unsure if complication from [place] War   . Alcoholism Unknown   . Heart attack Unknown        uncles  . Hyperlipidemia Mother      Current medication list and allergy/intolerance information reviewed:    Current Outpatient Medications  Medication Sig Dispense Refill  . albuterol (PROVENTIL HFA;VENTOLIN HFA) 108 (90 Base) MCG/ACT inhaler Inhale 2 puffs into the lungs every 4 (four) hours as needed for wheezing or shortness of breath. 2 Inhaler 11   No current  facility-administered medications for this visit.     Allergies  Allergen Reactions  . Shrimp [Shellfish Allergy]       Review of Systems:  Constitutional:  No  fever, no chills, No recent illness, No unintentional weight changes. No significant fatigue.   HEENT: No  headache, no vision change, no hearing change, No sore throat, No  sinus pressure  Cardiac: No  chest pain, No  pressure, No palpitations, no exercise intolerance  Respiratory:  No  shortness of breath. No  Cough  Gastrointestinal: No  abdominal pain, No  nausea, No  vomiting,  No  blood in stool, No  diarrhea, No  constipation   Musculoskeletal: No new myalgia/arthralgia  Skin: No  Rash, No other wounds/concerning lesions  Genitourinary: No urinary concerns   Hem/Onc: No  easy bruising/bleeding,  Endocrine: No cold intolerance,  No heat intolerance. No polyuria/polydipsia/polyphagia   Neurologic: No  weakness, No  dizziness  Psychiatric: No  concerns with depression, No  concerns with anxiety, No sleep problems, No mood problems  Depression screen [facility] 2/9 09/19/2017 08/16/2016  Decreased Interest 0 0  Down, Depressed, Hopeless 0 0  PHQ - 2 Score 0 0  Altered sleeping 0 -  Tired, decreased energy 0 -  Change in appetite 0 -  Feeling bad or failure about yourself  0 -  Trouble concentrating 0 -  Moving slowly or fidgety/restless 0 -  Suicidal thoughts 0 -  PHQ-9 Score 0 -  Difficult doing work/chores Not difficult at all -   GAD 7 : Generalized Anxiety Score 09/19/2017  Nervous, Anxious, on Edge 0  Control/stop worrying 0  Worry too much - different things 0  Trouble relaxing 0  Restless 0  Easily annoyed or irritable 0  Afraid - awful might happen 0  Total GAD 7 Score 0  Anxiety Difficulty Not difficult at all     Exam:  BP 130/80   Pulse 66   Temp 97.9 F (36.6 C) (Oral)   Ht 6\' 6"  (1.981 m)   Wt (!) 311 lb (141.1 kg)   BMI 35.94 kg/m   Constitutional: VS see above. General  Appearance: alert, well-developed, well-nourished, NAD  Eyes: Normal lids and conjunctive, non-icteric sclera  Ears, Nose, Mouth, Throat: MMM, Normal external inspection ears/nares/mouth/lips/gums. TM normal bilaterally. Pharynx/tonsils no erythema, no exudate. Nasal mucosa normal.   Neck: No masses, trachea midline. No thyroid enlargement. No tenderness/mass appreciated. No lymphadenopathy  Respiratory: Normal respiratory effort. no wheeze, no rhonchi, no rales  Cardiovascular: S1/S2 normal, no murmur, no rub/gallop auscultated. RRR. No lower extremity edema.   Gastrointestinal: Nontender, no masses. No hepatomegaly, no splenomegaly. +umbilical hernia appreciated. Bowel sounds normal. Rectal exam deferred.   Musculoskeletal: Gait normal. No clubbing/cyanosis of digits.   Neurological: Normal balance/coordination. No tremor. No cranial nerve deficit on limited exam. Motor and sensation intact and symmetric. Cerebellar reflexes intact.   Skin: warm, dry, intact.  Psychiatric: Normal judgment/insight. Normal mood and affect. Oriented x3.     ASSESSMENT/PLAN:   Annual physical exam - Plan: COMPLETE METABOLIC PANEL WITH GFR, CBC, Lipid panel  Elevated fasting glucose - Plan: Hemoglobin A1c  Umbilical hernia without obstruction and without gangrene - Plan: Ambulatory referral to General Surgery   MALE PREVENTIVE CARE  updated 09/19/17  ANNUAL SCREENING/COUNSELING  Any changes to health in the past year? no  Diet/Exercise - HEALTHY HABITS DISCUSSED TO DECREASE CV RISK Social History   Tobacco Use  Smoking Status Former Smoker  . Last attempt to quit: 10/16/2005  . Years since quitting: 11.9  Smokeless Tobacco Current User  . Types: Snuff   Social History   Substance and Sexual Activity  Alcohol Use Yes  . Alcohol/week: 0.6 oz  . Types: 1 Standard drinks or equivalent per week   [facility] screen [facility] 2/9 08/16/2016  Decreased Interest 0  Down, Depressed, Hopeless 0   PHQ - 2 Score 0    SEXUAL/REPRODUCTIVE HEALTH  Sexually active in the past year? - Yes with male.  STI testing needed/desired today? - no  Any concerns with testosterone/libido? - no  INFECTIOUS DISEASE SCREENING  HIV - needs per CDC guidelines, declined and low risk   GC/CT - does not need  HepC - does not need  TB - does not need  CANCER SCREENING  Lung - USPSTF: 55-80yo w/ 30 py hx unless quit w/in 2626yr - does not need  Colon - does not need - no FH  Prostate - does not need - no FH  OTHER DISEASE SCREENING  Lipid - needs  DM2 - needs  AAA - 65-75yo ever smoked: does not need  Osteoporosis - men 47yo+ - does not need  ADULT VACCINATION  Influenza - annual vaccine recommended  Td - booster every 10 years recommended - updated today   Zoster - Shingrix recommended 4850+ years old  PCV13 - was not indicated  PPSV23 - was not indicated  OTHER  Fall - exercise and Vit D age 33+ - does not need  Consider ASA - age 86-59 - does not need     Visit summary with medication list and pertinent instructions was printed for patient to review. All questions at time of visit were answered - patient instructed to contact office with any additional concerns. ER/RTC precautions were reviewed with the patient.   Follow-up plan: Return in about 1 year (around 09/20/2018) for ANNUAL PHYSICAL - sooner if needed / based on labs .    Please note: voice recognition software was used to produce this document, and typos may escape review. Please contact Dr. Lyn Hollingshead for any needed clarifications.

## 2017-09-19 NOTE — Addendum Note (Signed)
Addended by: Jed LimerickHODGES, Caylei Sperry on: 09/19/2017 09:36 AM   Modules accepted: Orders

## 2017-10-10 ENCOUNTER — Telehealth: Payer: Self-pay | Admitting: Osteopathic Medicine

## 2017-10-10 NOTE — Telephone Encounter (Signed)
Patient was prescribed an albuterol inhaler in June. He stated that it does not work as well as using a nebulizer. Patient is requesting that a new [facility] be [facility] [place] for albuterol that he can use in the nebulizer. Please advise patient. Thanks!

## 2017-10-10 NOTE — Telephone Encounter (Signed)
I called and left a message.  I am not sure how frequently patient is using his nebulizer or rescue inhaler.  If he is having to use a rescue inhaler frequently or having use the albuterol nebulizer frequent he needs a controller medicine in addition to nebulizer.  I called and left a message she should call back and asked to speak with triage.

## 2017-10-10 NOTE — Telephone Encounter (Signed)
Routing to covering Provider for new Rx.

## 2018-01-03 LAB — COMPLETE METABOLIC PANEL WITH GFR
AG Ratio: 1.8 (calc) (ref 1.0–2.5)
ALKALINE PHOSPHATASE (APISO): 114 U/L (ref 40–115)
ALT: 37 U/L (ref 9–46)
AST: 25 U/L (ref 10–40)
Albumin: 4.5 g/dL (ref 3.6–5.1)
BUN: 17 mg/dL (ref 7–25)
CHLORIDE: 102 mmol/L (ref 98–110)
CO2: 30 mmol/L (ref 20–32)
CREATININE: 0.98 mg/dL (ref 0.60–1.35)
Calcium: 9.5 mg/dL (ref 8.6–10.3)
GFR, Est African American: 107 mL/min/{1.73_m2} (ref >=60)
GFR, Est Non African American: 92 mL/min/{1.73_m2} (ref >=60)
GLOBULIN: 2.5 g/dL (ref 1.9–3.7)
Glucose, Bld: 97 mg/dL (ref 65–99)
Potassium: 4.9 mmol/L (ref 3.5–5.3)
SODIUM: 139 mmol/L (ref 135–146)
Total Bilirubin: 1.6 mg/dL — ABNORMAL HIGH (ref 0.2–1.2)
Total Protein: 7 g/dL (ref 6.1–8.1)

## 2018-01-03 LAB — HEMOGLOBIN A1C
Hgb A1c MFr Bld: 5.3 % of total Hgb (ref <5.7)
Mean Plasma Glucose: 105 (calc)
eAG (mmol/L): 5.8 (calc)

## 2018-01-03 LAB — CBC
HEMATOCRIT: 44.2 % (ref 38.5–50.0)
Hemoglobin: 15.2 g/dL (ref 13.2–17.1)
MCH: 29.6 pg (ref 27.0–33.0)
MCHC: 34.4 g/dL (ref 32.0–36.0)
MCV: 86.2 fL (ref 80.0–100.0)
MPV: 12.4 fL (ref 7.5–12.5)
Platelets: 155 10*3/uL (ref 140–400)
RBC: 5.13 10*6/uL (ref 4.20–5.80)
RDW: 12.2 % (ref 11.0–15.0)
WBC: 5.3 10*3/uL (ref 3.8–10.8)

## 2018-01-03 LAB — LIPID PANEL
CHOLESTEROL: 205 mg/dL — AB (ref <200)
HDL: 40 mg/dL — AB (ref >40)
LDL Cholesterol (Calc): 145 mg/dL (calc) — ABNORMAL HIGH
NON-HDL CHOLESTEROL (CALC): 165 mg/dL — AB (ref <130)
TRIGLYCERIDES: 95 mg/dL (ref <150)
Total CHOL/HDL Ratio: 5.1 (calc) — ABNORMAL HIGH (ref <5.0)

## 2018-03-01 ENCOUNTER — Encounter: Payer: Self-pay | Admitting: Physician Assistant

## 2018-03-01 ENCOUNTER — Ambulatory Visit (INDEPENDENT_AMBULATORY_CARE_PROVIDER_SITE_OTHER): Payer: BLUE CROSS/BLUE SHIELD | Admitting: Physician Assistant

## 2018-03-01 VITALS — BP 146/93 | HR 67 | Temp 97.7°F | Wt 319.0 lb

## 2018-03-01 DIAGNOSIS — R030 Elevated blood-pressure reading, without diagnosis of hypertension: Secondary | ICD-10-CM | POA: Diagnosis not present

## 2018-03-01 DIAGNOSIS — R05 Cough: Secondary | ICD-10-CM | POA: Diagnosis not present

## 2018-03-01 DIAGNOSIS — R058 Other specified cough: Secondary | ICD-10-CM

## 2018-03-01 DIAGNOSIS — J4521 Mild intermittent asthma with (acute) exacerbation: Secondary | ICD-10-CM | POA: Diagnosis not present

## 2018-03-01 MED ORDER — BUDESONIDE-FORMOTEROL FUMARATE 160-4.5 MCG/ACT IN AERO: 2.0000 | INHALATION_SPRAY | Freq: Two times a day (BID) | RESPIRATORY_TRACT | 0 refills | Status: DC

## 2018-03-01 MED ORDER — HYDROCOD POLST-CPM POLST ER 10-8 MG/5ML PO SUER
5.0000 mL | Freq: Every evening | ORAL | 0 refills | Status: AC | PRN
Start: 2018-03-01 — End: 2018-03-06

## 2018-03-01 MED ORDER — ALBUTEROL SULFATE HFA 108 (90 BASE) MCG/ACT IN AERS: 2.0000 | INHALATION_SPRAY | RESPIRATORY_TRACT | 11 refills | Status: DC | PRN

## 2018-03-01 NOTE — Progress Notes (Signed)
HPI:                                                                Kaelyn Nauta is a 47 y.o. male who presents to [facility] today for cough  Cough x 2 weeks. Associated with wheezing and dyspnea on exertion. Reports cough is really only present at night when he lies down. Denies fever, chills, malaise, chest pain, hemoptysis, orthopnea. He was evaluated at [facility] on 02/19/18 and diagnosed with a viral URI Currently taking Mucinex  Using his Albuterol several times per day Former smoker, quit smoking in distant past History of asthma     Past Medical History:  Diagnosis Date  . Asthmatic bronchitis    History reviewed. No pertinent surgical history. Social History   Tobacco Use  . Smoking status: Former Smoker    Last attempt to quit: 10/16/2005    Years since quitting: 12.3  . Smokeless tobacco: Current User    Types: Chew  Substance Use Topics  . Alcohol use: Yes    Alcohol/week: 1.0 standard drinks    Types: 1 Standard drinks or equivalent per week   family history includes Alcoholism in his unknown relative; Cancer in his father; Heart attack in his unknown relative; Hyperlipidemia in his mother.    ROS: negative except as noted in the HPI  Medications: Current Outpatient Medications  Medication Sig Dispense Refill  . albuterol (PROVENTIL HFA;VENTOLIN HFA) 108 (90 Base) MCG/ACT inhaler Inhale 2 puffs into the lungs every 4 (four) hours as needed for wheezing or shortness of breath. 2 Inhaler 11  . budesonide-formoterol (SYMBICORT) 160-4.5 MCG/ACT inhaler Inhale 2 puffs into the lungs 2 (two) times daily. 1 Inhaler 0  . chlorpheniramine-HYDROcodone (TUSSIONEX) 10-8 MG/5ML SUER Take 5 mLs by mouth at bedtime as needed for up to 5 days for cough. 25 mL 0   No current facility-administered medications for this visit.    Allergies  Allergen Reactions  . Shellfish Allergy Swelling    Lips tingling,  trouble swallowing       Objective:  BP (!) 146/93   Pulse 67   Temp 97.7 F (36.5 C) (Oral)   Wt (!) 319 lb (144.7 kg)   SpO2 98%   BMI 36.86 kg/m  Gen:  alert, not ill-appearing, no distress, appropriate for age HEENT: head normocephalic without obvious abnormality, conjunctiva and cornea clear, oropharynx clear, no cervical adenopathy, trachea midline Pulm: Normal work of breathing, normal phonation, clear to auscultation bilaterally, no wheezes, rales or rhonchi CV: Normal rate, regular rhythm, s1 and s2 distinct, no murmurs, clicks or rubs  Neuro: alert and oriented x 3, no tremor MSK: extremities atraumatic, normal gait and station Skin: intact, no rashes on exposed skin, no jaundice, no cyanosis     No results found for this or any previous visit (from the past 72 hour(s)). No results found.    Assessment and Plan: 47 y.o. male with   .Diagnoses and all orders for this visit:  Mild intermittent asthma with acute exacerbation -     albuterol (PROVENTIL HFA;VENTOLIN HFA) 108 (90 Base) MCG/ACT inhaler; Inhale 2 puffs into the lungs every 4 (four) hours as needed for wheezing or shortness of breath. -  budesonide-formoterol (SYMBICORT) 160-4.5 MCG/ACT inhaler; Inhale 2 puffs into the lungs 2 (two) times daily.  Post-viral cough syndrome -     chlorpheniramine-HYDROcodone (TUSSIONEX) 10-8 MG/5ML SUER; Take 5 mLs by mouth at bedtime as needed for up to 5 days for cough.   Afebrile, no tachypnea, no tachycardia, pulse ox 98% on room air at rest, no adventitious lung sounds.   Patient reporting increased use of rescue inhaler.  Differential is asthma exacerbation versus post viral cough syndrome.  I have prescribed him Symbicort for his asthma and to decrease use of rescue inhaler.  He was also given Tussionex for use at bedtime only.  He was advised to avoid combining Tussionex with alcohol or any sedating medications.    Patient education and anticipatory guidance  given Patient agrees with treatment plan Follow-up as needed if symptoms worsen or fail to improve  Levonne Hubertharley E. Ellington Cornia PA-C

## 2018-03-01 NOTE — Patient Instructions (Signed)
Bronchospasm, Adult Bronchospasm is a tightening of the airways going into the lungs. During an episode, it may be harder to breathe. You may cough, and you may make a whistling sound when you breathe (wheeze). This condition often affects people with asthma. What are the causes? This condition is caused by swelling and irritation in the airways. It can be triggered by:  An infection (common).  Seasonal allergies.  An allergic reaction.  Exercise.  Irritants. These include pollution, cigarette smoke, strong odors, aerosol sprays, and paint fumes.  Weather changes. Winds increase molds and pollens in the air. Cold air may cause swelling.  Stress and emotional upset.  What are the signs or symptoms? Symptoms of this condition include:  Wheezing. If the episode was triggered by an allergy, wheezing may start right away or hours later.  Nighttime coughing.  Frequent or severe coughing with a simple cold.  Chest tightness.  Shortness of breath.  Decreased ability to exercise.  How is this diagnosed? This condition is usually diagnosed with a review of your medical history and a physical exam. Tests, such as lung function tests, are sometimes done to look for other conditions. The need for a chest X-ray depends on where the wheezing occurs and whether it is the first time you have wheezed. How is this treated? This condition may be treated with:  Inhaled medicines. These open up the airways and help you breathe. They can be taken with an inhaler or a nebulizer device.  Corticosteroid medicines. These may be given for severe bronchospasm, usually when it is associated with asthma.  Avoiding triggers, such as irritants, infection, or allergies.  Follow these instructions at home: Medicines  Take over-the-counter and prescription medicines only as told by your health care provider.  If you need to use an inhaler or nebulizer to take your medicine, ask your health care  provider to explain how to use it correctly. If you were given a spacer, always use it with your inhaler. Lifestyle  Reduce the number of triggers in your home. To do this: ? Change your heating and air conditioning filter at least once a month. ? Limit your use of fireplaces and wood stoves. ? Do not smoke. Do not allow smoking in your home. ? Avoid using perfumes and fragrances. ? Get rid of pests, such as roaches and mice, and their droppings. ? Remove any mold from your home. ? Keep your house clean and dust free. Use unscented cleaning products. ? Replace carpet with wood, tile, or vinyl flooring. Carpet can trap dander and dust. ? Use allergy-proof pillows, mattress covers, and box spring covers. ? Wash bed sheets and blankets every week in hot water. Dry them in a dryer. ? Use blankets that are made of polyester or cotton. ? Wash your hands often. ? Do not allow pets in your bedroom.  Avoid breathing in cold air when you exercise. General instructions  Have a plan for seeking medical care. Know when to call your health care provider and local emergency services, and where to get emergency care.  Stay up to date on your immunizations.  When you have an episode of bronchospasm, stay calm. Try to relax and breathe more slowly.  If you have asthma, make sure you have an asthma action plan.  Keep all follow-up visits as told by your health care provider. This is important. Contact a health care provider if:  You have muscle aches.  You have chest pain.  The mucus that you   cough up (sputum) changes from clear or white to yellow, green, gray, or bloody.  You have a fever.  Your sputum gets thicker. Get help right away if:  Your wheezing and coughing get worse, even after you take your prescribed medicines.  It gets even harder to breathe.  You develop severe chest pain. Summary  Bronchospasm is a tightening of the airways going into the lungs.  During an episode of  bronchospasm, you may have a harder time breathing. You may cough and make a whistling sound when you breathe (wheeze).  Avoid exposure to triggers such as smoke, dust, mold, animal dander, and fragrances.  When you have an episode of bronchospasm, stay calm. Try to relax and breathe more slowly. This information is not intended to replace advice given to you by your health care provider. Make sure you discuss any questions you have with your health care provider. Document Released: 03/25/2003 Document Revised: 03/18/2016 Document Reviewed: 03/18/2016 Elsevier Interactive Patient Education  2017 [employer] Inc.  

## 2018-05-15 IMAGING — DX DG ANKLE COMPLETE 3+V*R*
3 series · 3 of 3 positions shown · non-contrast
Comparison: None.

CLINICAL DATA: Right ankle pain laterally. Twisted ankle 2 years
ago with continued pain.

EXAM:
RIGHT ANKLE - COMPLETE 3+ VIEW

[ankle ap]
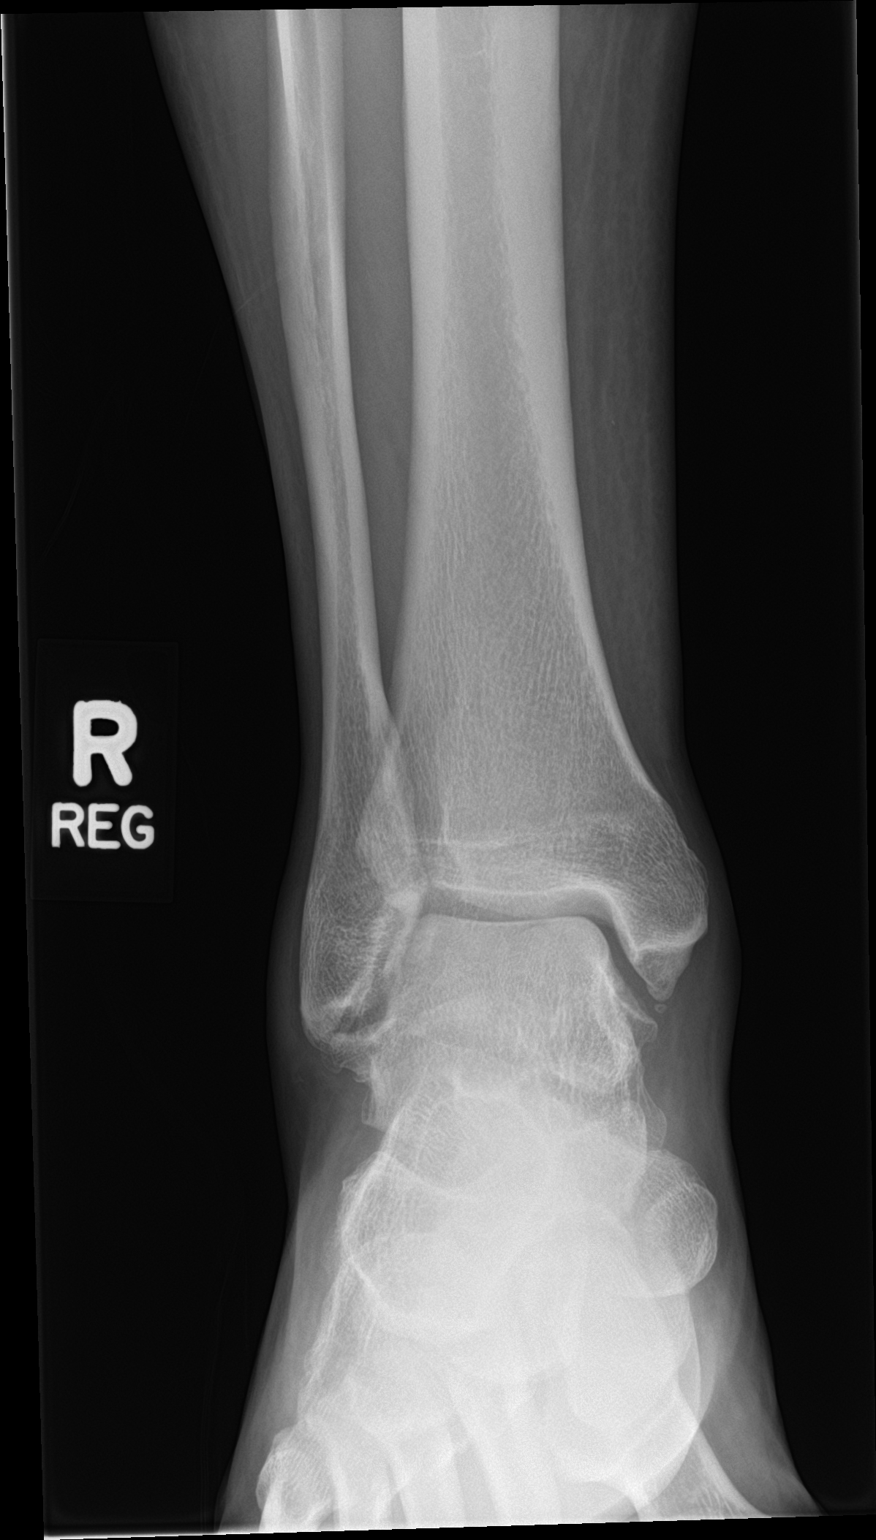

[ankle obl]
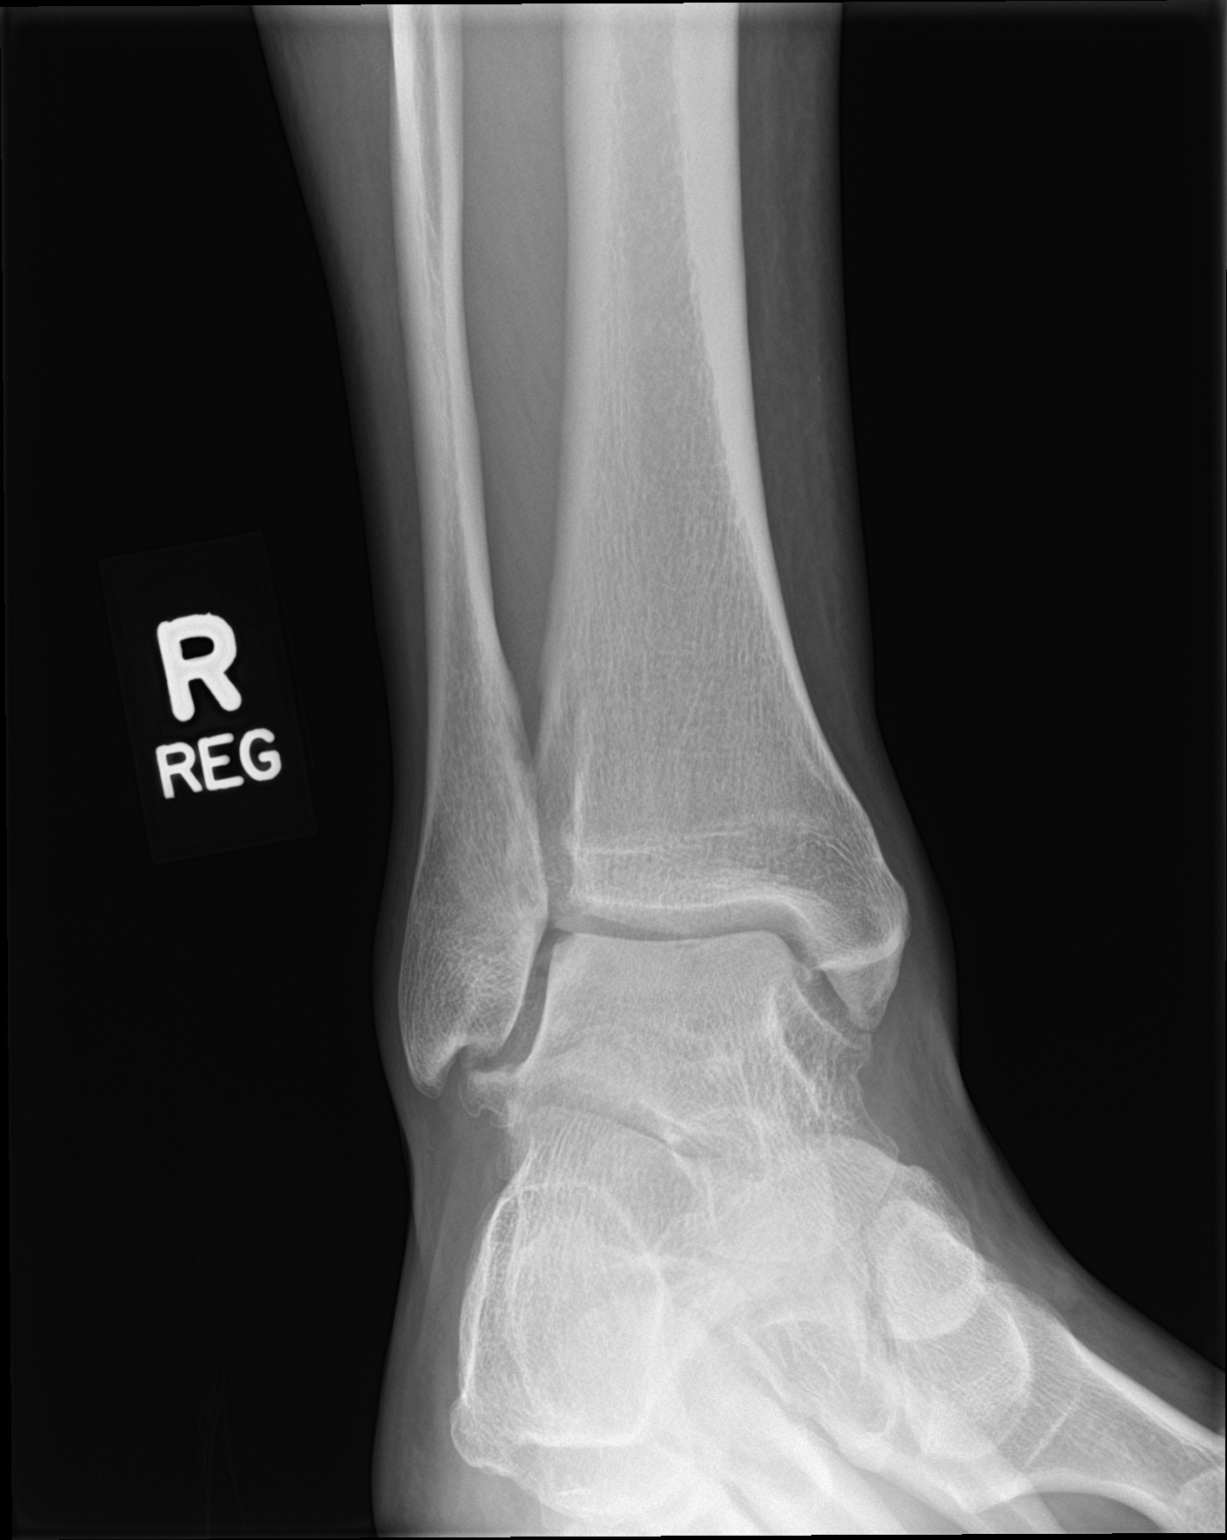

[ankle lat]
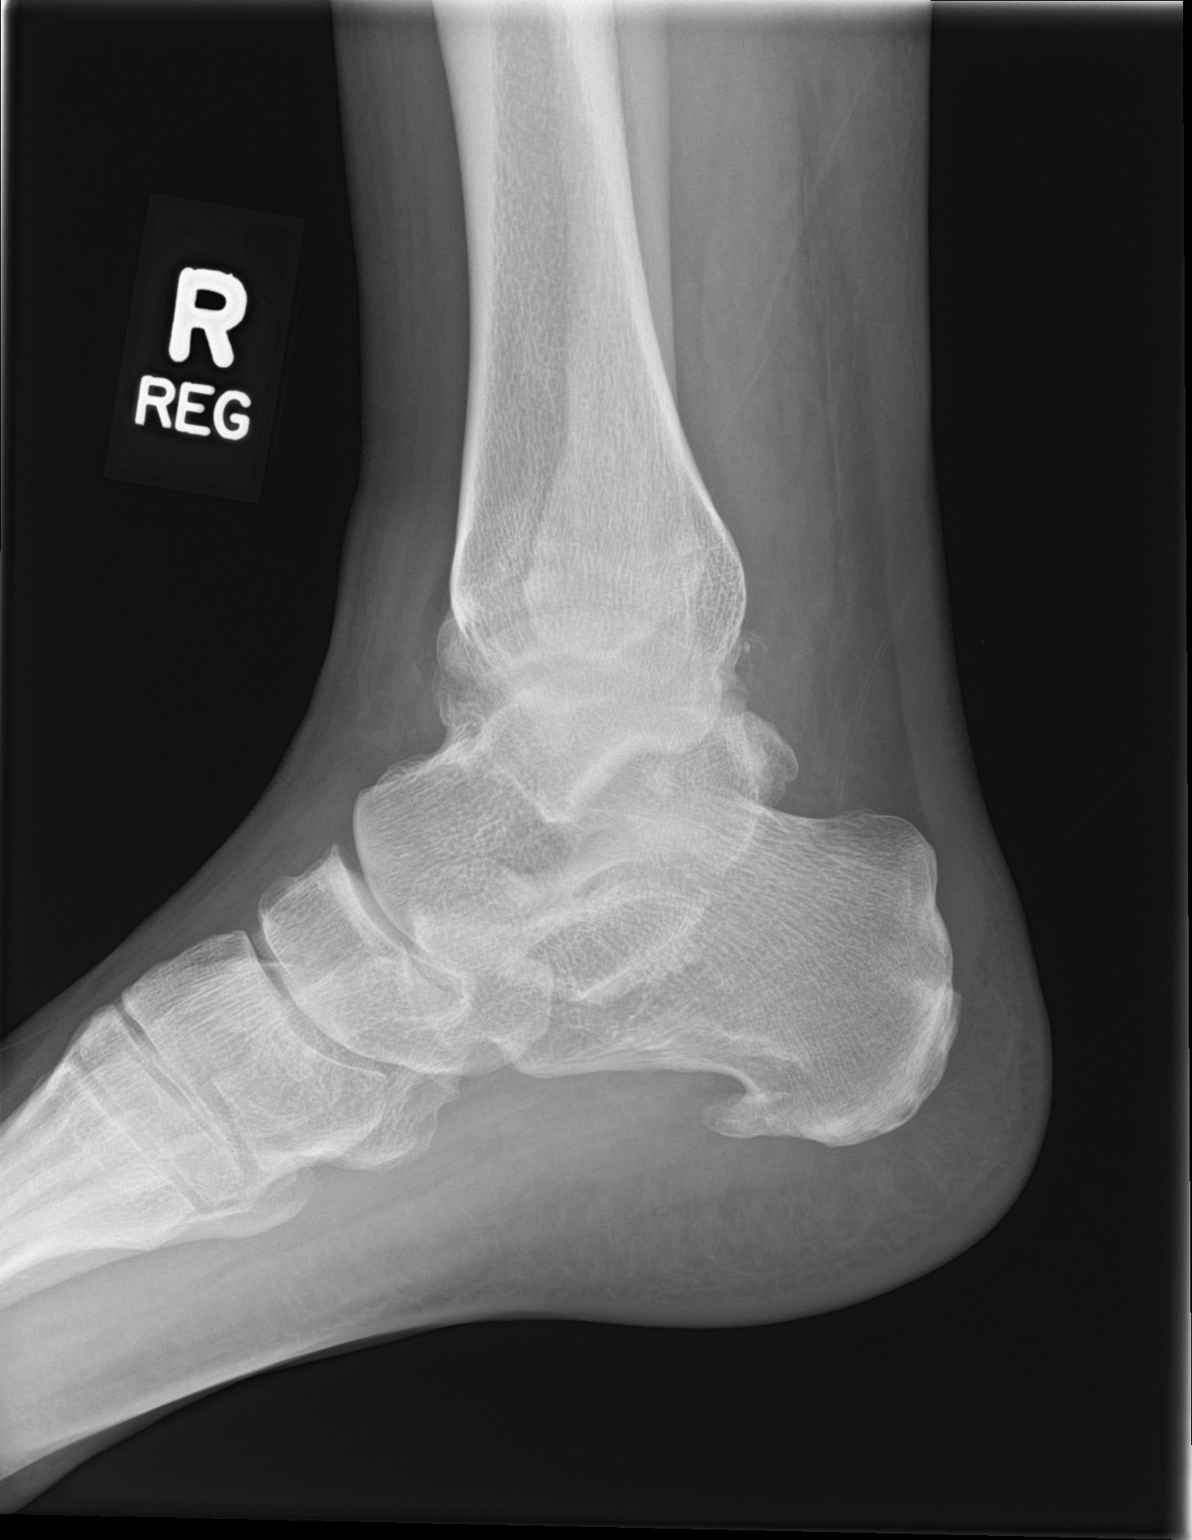

[3 of 3 positions shown; findings below may reference images not displayed]

FINDINGS: Mild arthritic changes with spurring noted in the ankle. No acute
bony abnormality. Specifically, no fracture, subluxation, or
dislocation. Soft tissues are intact. Plantar calcaneal spur.
IMPRESSION: No acute bony abnormality.  Mild osteoarthritic changes.

## 2018-09-20 ENCOUNTER — Encounter: Payer: BLUE CROSS/BLUE SHIELD | Admitting: Osteopathic Medicine

## 2018-10-09 ENCOUNTER — Ambulatory Visit (INDEPENDENT_AMBULATORY_CARE_PROVIDER_SITE_OTHER): Payer: BC Managed Care – PPO | Admitting: Osteopathic Medicine

## 2018-10-09 ENCOUNTER — Encounter: Payer: Self-pay | Admitting: Osteopathic Medicine

## 2018-10-09 ENCOUNTER — Other Ambulatory Visit: Payer: Self-pay

## 2018-10-09 VITALS — BP 131/75 | HR 63 | Temp 97.8°F | Wt 319.4 lb

## 2018-10-09 DIAGNOSIS — R7301 Impaired fasting glucose: Secondary | ICD-10-CM

## 2018-10-09 DIAGNOSIS — L729 Follicular cyst of the skin and subcutaneous tissue, unspecified: Secondary | ICD-10-CM | POA: Diagnosis not present

## 2018-10-09 DIAGNOSIS — Z0000 Encounter for general adult medical examination without abnormal findings: Secondary | ICD-10-CM | POA: Diagnosis not present

## 2018-10-09 DIAGNOSIS — M67472 Ganglion, left ankle and foot: Secondary | ICD-10-CM

## 2018-10-09 NOTE — Patient Instructions (Addendum)
General Preventive Care  Most recent routine screening lipids/other labs: ordered  Everyone should have blood pressure checked once per year.   Tobacco: don't! Please let me know if you need help quitting!  Alcohol: responsible moderation is ok for most adults - if you have concerns about your alcohol intake, please talk to me!   Exercise: as tolerated to reduce risk of cardiovascular disease and diabetes. Strength training will also prevent osteoporosis.   Mental health: if need for mental health care (medicines, counseling, other), or concerns about moods, please let me know!   Sexual health: if need for STD testing, or if concerns with libido/pain problems, please let me know!   Advanced Directive: Living Will and/or Healthcare Power of Attorney recommended for all adults, regardless of age or health.  Vaccines  Flu vaccine: recommended for almost everyone, every fall.   Shingles vaccine: Shingrix recommended after age 53.   Pneumonia vaccines: Prevnar and Pneumovax recommended after age 88, or sooner if certain medical conditions.  Tetanus booster: Tdap recommended every 10 years. Due 09/2027 Cancer screenings   Colon cancer screening: recommended for everyone at age 70, but some folks need a colonoscopy sooner if risk factors   Prostate cancer screening: PSA blood test around age 78  Lung cancer screening: not needed Infection screenings . HIV: recommended screening at least once age 95-65, more often as needed. . Gonorrhea/Chlamydia: screening as needed. . Hepatitis C: recommended for anyone born 26-1965 . TB: certain at-risk populations, or depending on work requirements and/or travel history Other . Bone Density Test: recommended for men at age 6, sooner depending on risk factors  Abdominal Aortic Aneurysm: screening with ultrasound recommended once for men age 80-75 who have ever smoked

## 2018-10-09 NOTE — Progress Notes (Signed)
HPI: Kent Hammond is a 48 y.o. male who  has a past medical history of Asthmatic bronchitis.  he [facility] to [facility] today, 10/09/18,  for chief complaint of: Annual physical     Patient here for annual physical / wellness exam.  See preventive care reviewed as below.    Additional concerns today include:   Lump on L chest, present for years, not bothering him but wife wanted it looked at.   Lump on L foot, also not bothering him, present a couple months.       Past medical, surgical, social and family history reviewed:  Patient Active Problem List   Diagnosis Date Noted  . Umbilical hernia without obstruction and without gangrene 09/19/2017  . Asthma, chronic 06/17/2014  . Pigmented skin lesion 06/17/2014  . Seborrheic keratoses 06/17/2014   No past surgical history on file.  Social History   Tobacco Use  . Smoking status: Former Smoker    Quit date: 10/16/2005    Years since quitting: 12.9  . Smokeless tobacco: Current User    Types: Chew  Substance Use Topics  . Alcohol use: Yes    Alcohol/week: 1.0 standard drinks    Types: 1 Standard drinks or equivalent per week    Family History  Problem Relation Age of Onset  . Cancer Father        brain cancer, unsure if complication from [place]   . Alcoholism Unknown   . Heart attack Unknown        uncles  . Hyperlipidemia Mother      Current medication list and allergy/intolerance information reviewed:    Current Outpatient Medications  Medication Sig Dispense Refill  . albuterol (PROVENTIL HFA;VENTOLIN HFA) 108 (90 Base) MCG/ACT inhaler Inhale 2 puffs into the lungs every 4 (four) hours as needed for wheezing or shortness of breath. 2 Inhaler 11   No current facility-administered medications for this visit.     Allergies  Allergen Reactions  . Shellfish Allergy Swelling    Lips tingling, trouble swallowing      Review of Systems:  Constitutional:  No   fever, no chills, No recent illness, No unintentional weight changes. No significant fatigue.   HEENT: No  headache, no vision change, no hearing change, No sore throat, No  sinus pressure   Cardiac: No  chest pain, No  pressure, No palpitations, No  Orthopnea  Respiratory:  No  shortness of breath. No  Cough  Gastrointestinal: No  abdominal pain, No  nausea, No  vomiting,  No  blood in stool, No  diarrhea, No  constipation   Musculoskeletal: No new myalgia/arthralgia  Skin: No  Rash  Genitourinary: No  incontinence, No  abnormal genital bleeding, No abnormal genital discharge  Hem/Onc: No  easy bruising/bleeding,   Endocrine: No cold intolerance,  No heat intolerance. No polyuria/polydipsia/polyphagia   Neurologic: No  weakness, No  dizziness  Psychiatric: No  concerns with depression, No  concerns with anxiety, No sleep problems, No mood problems  Exam:  BP 131/75 (BP Location: Left Arm, Patient Position: Sitting, Cuff Size: Large)   Pulse 63   Temp 97.8 F (36.6 C) (Oral)   Wt (!) 319 lb 6.4 oz (144.9 kg)   BMI 36.91 kg/m   Constitutional: VS see above. General Appearance: alert, well-developed, well-nourished, NAD  Eyes: Normal lids and conjunctive, non-icteric sclera  Ears, Nose, Mouth, Throat: MMM, Normal external inspection ears/nares/mouth/lips/gums. TM normal bilaterally. Pharynx/tonsils no erythema, no exudate.  Nasal mucosa normal.   Neck: No masses, trachea midline. No thyroid enlargement. No tenderness/mass appreciated. No lymphadenopathy  Respiratory: Normal respiratory effort. no wheeze, no rhonchi, no rales  Cardiovascular: S1/S2 normal, no murmur, no rub/gallop auscultated. RRR. No lower extremity edema.  Gastrointestinal: Nontender, no masses. No hepatomegaly, no splenomegaly. No hernia appreciated. Bowel sounds normal. Rectal exam deferred.   Musculoskeletal: Gait normal. No clubbing/cyanosis of digits.   Neurological: Normal balance/coordination. No  tremor. No cranial nerve deficit on limited exam. Motor and sensation intact and symmetric. Cerebellar reflexes intact.   Skin: warm, dry, intact. No rash/ulcer. No concerning nevi on limited exam.  SubQ cyst on L chest, mobile, nontender, no erythema, Similar on dorsum L foot.   Psychiatric: Normal judgment/insight. Normal mood and affect. Oriented x3.         ASSESSMENT/PLAN: The primary encounter diagnosis was Annual physical exam. Diagnoses of Elevated fasting glucose, Subcutaneous cyst, and Ganglion cyst of left foot were also pertinent to this visit.   Orders Placed This Encounter  Procedures  . CBC  . COMPLETE METABOLIC PANEL WITH GFR  . Lipid panel  . Hemoglobin A1c    No orders of the defined types were placed in this encounter.   Patient Instructions  General Preventive Care  Most recent routine screening lipids/other labs: ordered  Everyone should have blood pressure checked once per year.   Tobacco: don't! Please let me know if you need help quitting!  Alcohol: responsible moderation is ok for most adults - if you have concerns about your alcohol intake, please talk to me!   Exercise: as tolerated to reduce risk of cardiovascular disease and diabetes. Strength training will also prevent osteoporosis.   Mental health: if need for mental health care (medicines, counseling, other), or concerns about moods, please let me know!   Sexual health: if need for STD testing, or if concerns with libido/pain problems, please let me know!   Advanced Directive: Living Will and/or Healthcare Power of Attorney recommended for all adults, regardless of age or health.  Vaccines  Flu vaccine: recommended for almost everyone, every fall.   Shingles vaccine: Shingrix recommended after age 3.   Pneumonia vaccines: Prevnar and Pneumovax recommended after age 93, or sooner if certain medical conditions.  Tetanus booster: Tdap recommended every 10 years. Due 09/2027 Cancer  screenings   Colon cancer screening: recommended for everyone at age 81, but some folks need a colonoscopy sooner if risk factors   Prostate cancer screening: PSA blood test around age 40  Lung cancer screening: not needed Infection screenings . HIV: recommended screening at least once age 97-65, more often as needed. . Gonorrhea/Chlamydia: screening as needed. . Hepatitis C: recommended for anyone born 54-1965 . TB: certain at-risk populations, or depending on work requirements and/or travel history Other . Bone Density Test: recommended for men at age 69, sooner depending on risk factors  Abdominal Aortic Aneurysm: screening with ultrasound recommended once for men age 91-75 who have ever smoked         Visit summary with medication list and pertinent instructions was printed for patient to review. All questions at time of visit were answered - patient instructed to contact office with any additional concerns or updates. ER/RTC precautions were reviewed with the patient.      Please note: voice recognition software was used to produce this document, and typos may escape review. Please contact Dr. Sheppard Coil for any needed clarifications.     Follow-up plan: Return for [place] (call  week prior to visit for lab orders).

## 2018-10-10 LAB — COMPLETE METABOLIC PANEL WITH GFR
AG Ratio: 1.7 (calc) (ref 1.0–2.5)
ALT: 43 U/L (ref 9–46)
AST: 26 U/L (ref 10–40)
Albumin: 4.5 g/dL (ref 3.6–5.1)
Alkaline phosphatase (APISO): 110 U/L (ref 36–130)
BUN: 15 mg/dL (ref 7–25)
CO2: 27 mmol/L (ref 20–32)
Calcium: 9.4 mg/dL (ref 8.6–10.3)
Chloride: 102 mmol/L (ref 98–110)
Creat: 0.96 mg/dL (ref 0.60–1.35)
GFR, Est African American: 109 mL/min/{1.73_m2} (ref >=60)
GFR, Est Non African American: 94 mL/min/{1.73_m2} (ref >=60)
Globulin: 2.7 g/dL (calc) (ref 1.9–3.7)
Glucose, Bld: 99 mg/dL (ref 65–99)
Potassium: 4.5 mmol/L (ref 3.5–5.3)
Sodium: 138 mmol/L (ref 135–146)
Total Bilirubin: 1.4 mg/dL — ABNORMAL HIGH (ref 0.2–1.2)
Total Protein: 7.2 g/dL (ref 6.1–8.1)

## 2018-10-10 LAB — CBC
HCT: 46.7 % (ref 38.5–50.0)
Hemoglobin: 15.7 g/dL (ref 13.2–17.1)
MCH: 29.7 pg (ref 27.0–33.0)
MCHC: 33.6 g/dL (ref 32.0–36.0)
MCV: 88.3 fL (ref 80.0–100.0)
MPV: 12.9 fL — ABNORMAL HIGH (ref 7.5–12.5)
Platelets: 160 10*3/uL (ref 140–400)
RBC: 5.29 10*6/uL (ref 4.20–5.80)
RDW: 12.4 % (ref 11.0–15.0)
WBC: 5.6 10*3/uL (ref 3.8–10.8)

## 2018-10-10 LAB — LIPID PANEL
Cholesterol: 197 mg/dL (ref <200)
HDL: 43 mg/dL (ref >=40)
LDL Cholesterol (Calc): 129 mg/dL (calc) — ABNORMAL HIGH
Non-HDL Cholesterol (Calc): 154 mg/dL (calc) — ABNORMAL HIGH (ref <130)
Total CHOL/HDL Ratio: 4.6 (calc) (ref <5.0)
Triglycerides: 137 mg/dL (ref <150)

## 2018-10-10 LAB — HEMOGLOBIN A1C
Hgb A1c MFr Bld: 5.6 % of total Hgb (ref <5.7)
Mean Plasma Glucose: 114 (calc)
eAG (mmol/L): 6.3 (calc)

## 2019-04-04 IMAGING — DX DG CHEST 2V
2 series · 2 of 2 positions shown · non-contrast
Comparison: None.

CLINICAL DATA: Cough and congestion for several days

EXAM:
CHEST  2 VIEW

[chest pa]
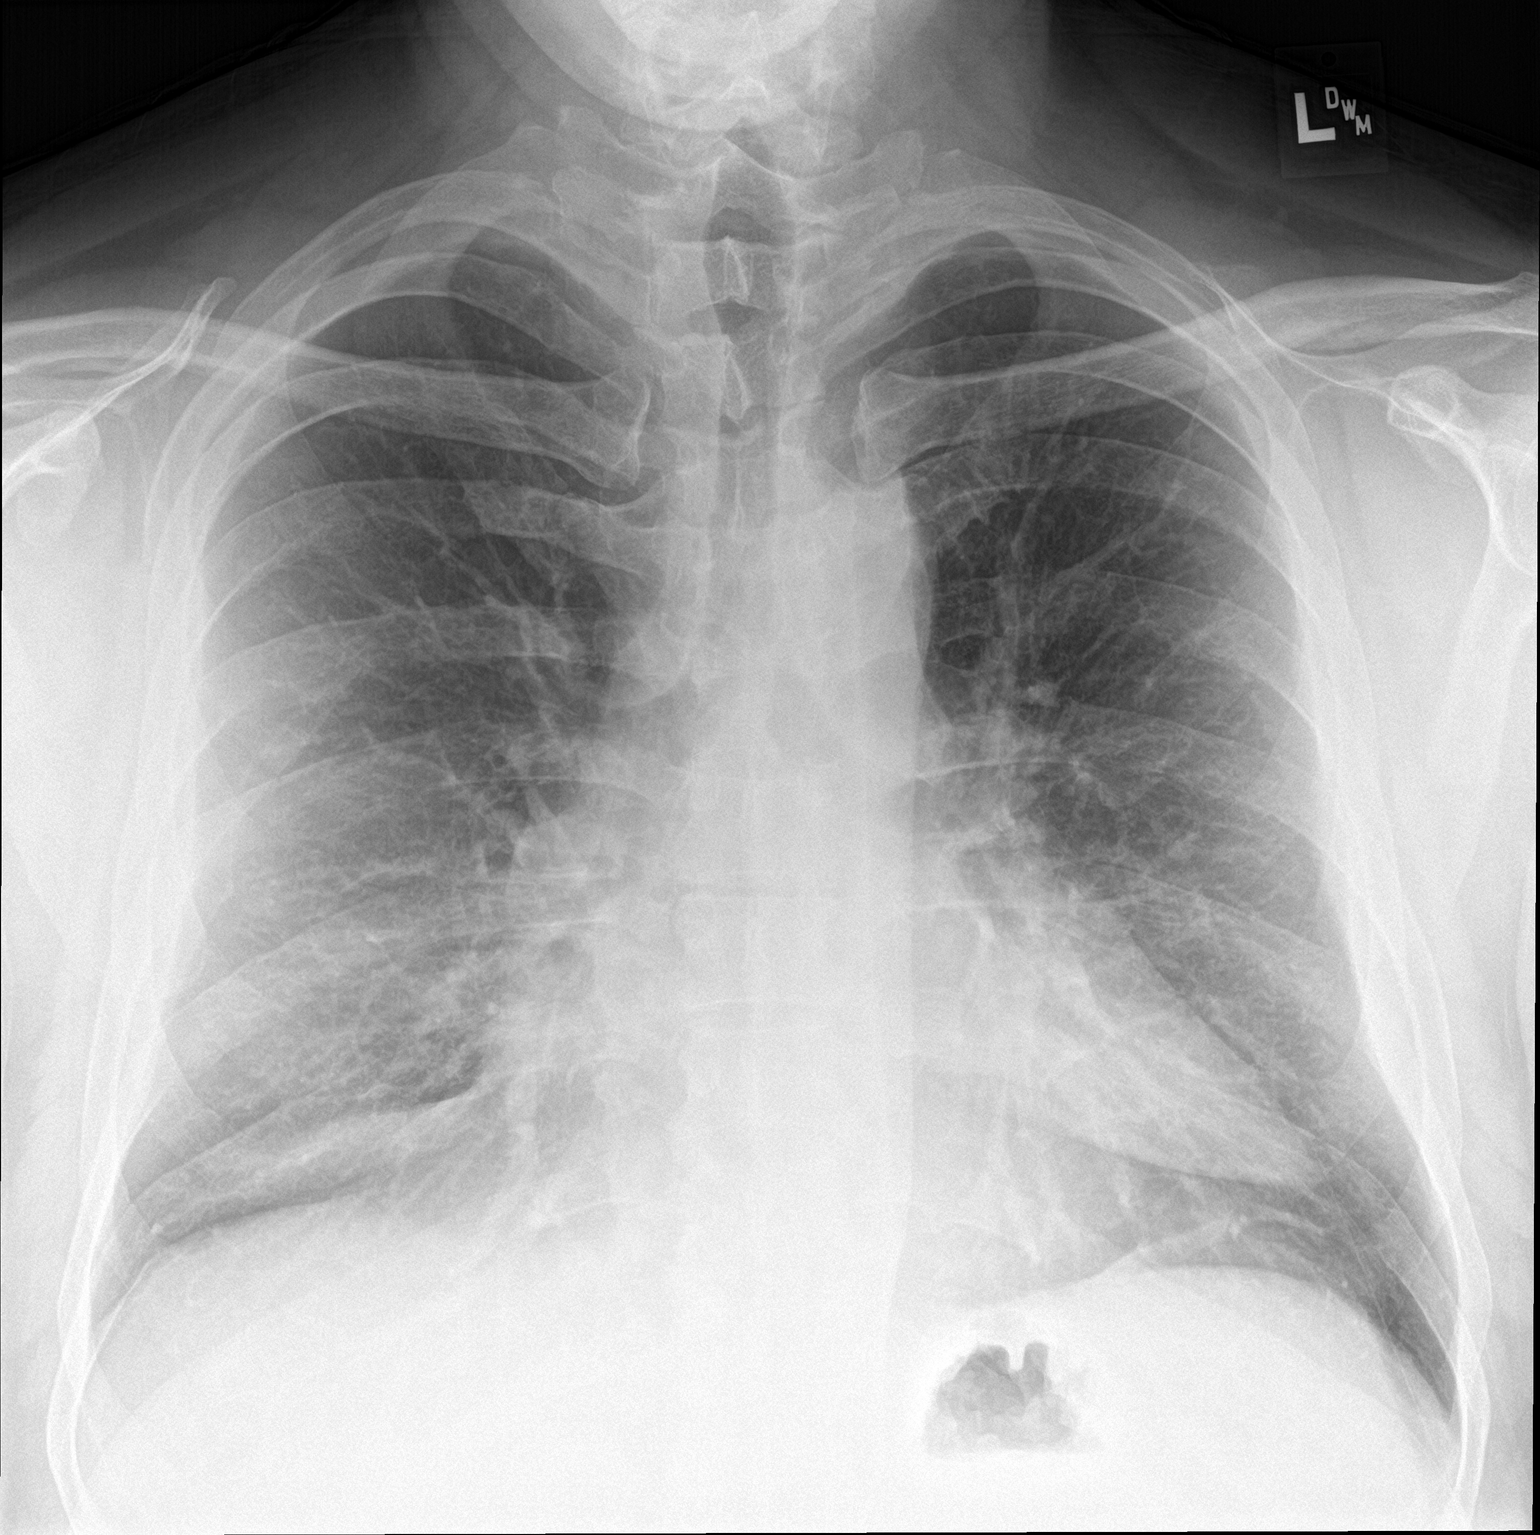

[chest lat]
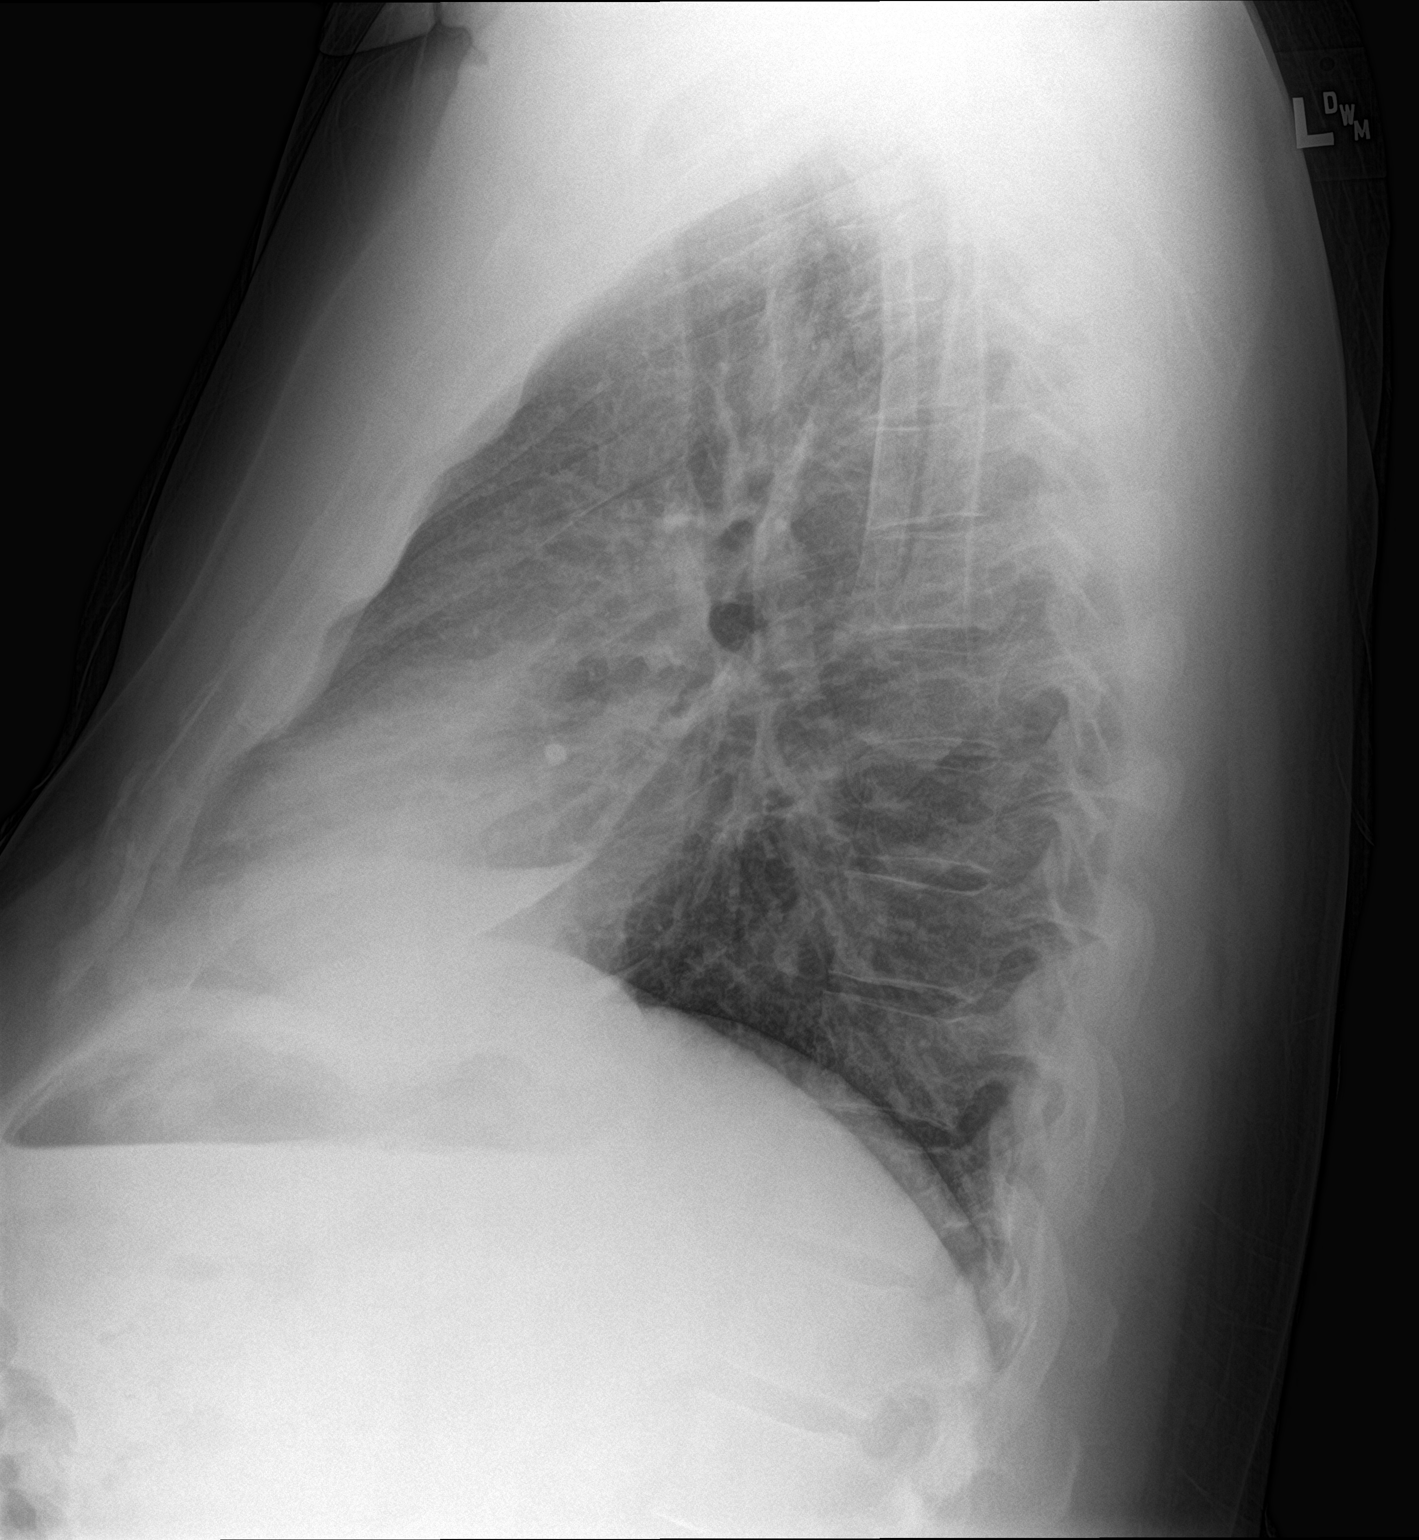

[2 of 2 positions shown; findings below may reference images not displayed]

FINDINGS: There is no appreciable edema or consolidation. Heart size and
pulmonary vascularity are normal. No adenopathy. No pneumothorax. No
bone lesions.
IMPRESSION: No edema or consolidation.

## 2019-08-24 ENCOUNTER — Emergency Department
Admission: EM | Admit: 2019-08-24 | Discharge: 2019-08-24 | Disposition: A | Discharge status: Home / Self Care | Payer: BC Managed Care – PPO | Source: Home / Self Care | Attending: Family Medicine | Admitting: Family Medicine

## 2019-08-24 ENCOUNTER — Encounter: Payer: Self-pay | Admitting: Emergency Medicine

## 2019-08-24 ENCOUNTER — Other Ambulatory Visit: Payer: Self-pay

## 2019-08-24 DIAGNOSIS — J069 Acute upper respiratory infection, unspecified: Secondary | ICD-10-CM

## 2019-08-24 DIAGNOSIS — J029 Acute pharyngitis, unspecified: Secondary | ICD-10-CM | POA: Diagnosis not present

## 2019-08-24 LAB — POCT RAPID STREP A (OFFICE): Rapid Strep A Screen: NEGATIVE

## 2019-08-24 MED ORDER — PREDNISONE 20 MG PO TABS: ORAL_TABLET | ORAL | 0 refills | Status: DC

## 2019-08-24 MED ORDER — AZITHROMYCIN 250 MG PO TABS: ORAL_TABLET | ORAL | 0 refills | Status: DC

## 2019-08-24 NOTE — Discharge Instructions (Addendum)
Take plain guaifenesin (1200mg  extended release tabs such as Mucinex) twice daily, with plenty of water, for cough and congestion.  May add Pseudoephedrine (30mg , one or two every 4 to 6 hours) for sinus congestion.  Get adequate rest.   For increased sinus congestion, may use Afrin nasal spray (or generic oxymetazoline) each morning for about 5 days and then discontinue.  Also recommend using saline nasal spray several times daily and saline nasal irrigation (AYR is a common brand).  Use Flonase nasal spray each morning after using Afrin nasal spray and saline nasal irrigation. Try warm salt water gargles for sore throat.  Stop all antihistamines (Zyrtec, etc) for now, and other non-prescription cough/cold preparations. May take Delsym Cough Suppressant at bedtime for nighttime cough.  Begin Azithromycin if not improving about one week or if persistent fever develops

## 2019-08-24 NOTE — ED Triage Notes (Signed)
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

## 2019-08-24 NOTE — ED Provider Notes (Signed)
[name]    CSN: [id number] Arrival date & time: 08/24/19  6301      History   Chief Complaint Chief Complaint  Patient presents with  . Cough    started last night    HPI Kent Hammond is a 49 y.o. male.   Yesterday afternoon patient began coughing, and complains of persistent sore throat with his cough.  He has a history of seasonal rhinitis but denies increase in nasal congestion.  He feels well and denies headache, fever, myalgias, etc.   He denies chest tightness, shortness of breath, and changes in taste/smell.    The history is provided by the patient.    Past Medical History:  Diagnosis Date  . Asthmatic bronchitis     Patient Active Problem List   Diagnosis Date Noted  . Sore throat 08/24/2019  . Umbilical hernia without obstruction and without gangrene 09/19/2017  . Asthma, chronic 06/17/2014  . Pigmented skin lesion 06/17/2014  . Seborrheic keratoses 06/17/2014    History reviewed. No pertinent surgical history.     Home Medications    Prior to Admission medications   Medication Sig Start Date End Date Taking? Authorizing Provider  azithromycin (ZITHROMAX Z-PAK) 250 MG tablet Take 2 tabs today; then begin one tab once daily for 4 more days. 08/24/19   Lattie Haw, MD  predniSONE (DELTASONE) 20 MG tablet Take one tab by mouth twice daily for 4 days, then one daily for 3 days. Take with food. 08/24/19   Lattie Haw, MD  albuterol (PROVENTIL HFA;VENTOLIN HFA) 108 (90 Base) MCG/ACT inhaler Inhale 2 puffs into the lungs every 4 (four) hours as needed for wheezing or shortness of breath. 03/01/18 08/24/19  Carlis Stable, PA-C    Family History Family History  Problem Relation Age of Onset  . Cancer Father        brain cancer, unsure if complication from [place] War   . Alcoholism Other   . Heart attack Other        uncles  . Hyperlipidemia Mother     Social History Social History   Tobacco Use  . Smoking  status: Former Smoker    Quit date: 10/16/2005    Years since quitting: 13.8  . Smokeless tobacco: Current User    Types: Chew  Substance Use Topics  . Alcohol use: Yes    Alcohol/week: 1.0 standard drinks    Types: 1 Standard drinks or equivalent per week  . Drug use: No     Allergies   Shellfish allergy   Review of Systems Review of Systems + sore throat with cogh + cough No pleuritic pain No wheezing No nasal congestion + post-nasal drainage No sinus pain/pressure No itchy/red eyes No earache No hemoptysis No SOB No fever/chills No nausea No vomiting No abdominal pain No diarrhea No urinary symptoms No skin rash No fatigue No myalgias No headache     Physical Exam Triage Vital Signs ED Triage Vitals  Enc Vitals Group     BP 08/24/19 0837 (!) 145/87     Pulse Rate 08/24/19 0837 68     Resp 08/24/19 0837 17     Temp 08/24/19 0837 98.7 F (37.1 C)     Temp Source 08/24/19 0837 Oral     SpO2 08/24/19 0837 98 %     Weight 08/24/19 0839 237 lb (107.5 kg)     Height 08/24/19 0839 6\' 6"  (1.981 m)     Head Circumference --  Peak Flow --      Pain Score 08/24/19 0839 0     Pain Loc --      Pain Edu? --      Excl. in GC? --    No data found.  Updated Vital Signs BP (!) 145/87   Pulse 68   Temp 98.7 F (37.1 C) (Oral)   Resp 17   Ht 6\' 6"  (1.981 m)   Wt 107.5 kg   SpO2 98%   BMI 27.39 kg/m   Visual Acuity Right Eye Distance:   Left Eye Distance:   Bilateral Distance:    Right Eye Near:   Left Eye Near:    Bilateral Near:     Physical Exam Nursing notes and Vital Signs reviewed. Appearance:  Patient appears stated age, and in no acute distress Eyes:  Pupils are equal, round, and reactive to light and accomodation.  Extraocular movement is intact.  Conjunctivae are not inflamed  Ears:  Canals normal.  Tympanic membranes normal.  Nose:  Mildly congested turbinates.  No sinus tenderness.   Pharynx:  Erythematous uvula Neck:  Supple.   Tender tonsillar nodes bilaterally   Lungs:  Clear to auscultation.  Breath sounds are equal.  Moving air well. Heart:  Regular rate and rhythm without murmurs, rubs, or gallops.  Abdomen:  Nontender without masses or hepatosplenomegaly.  Bowel sounds are present.  No CVA or flank tenderness.  Extremities:  No edema.  Skin:  No rash present.   UC Treatments / Results  Labs (all labs ordered are listed, but only abnormal results are displayed) Labs Reviewed  POCT RAPID STREP A (OFFICE) negative    EKG   Radiology No results found.  Procedures Procedures (including critical care time)  Medications Ordered in UC Medications - No data to display  Initial Impression / Assessment and Plan / UC Course  I have reviewed the triage vital signs and the nursing notes.  Pertinent labs & imaging results that were available during my care of the patient were reviewed by me and considered in my medical decision making (see chart for details).    Strep A DNA probe pending.  Because of his past history of asthma, will begin prednisone burst/taper.  There is no evidence of bacterial infection today.  Treat symptomatically for now. Followup with Family Doctor if not improved in about 10 days.   Final Clinical Impressions(s) / UC Diagnoses   Final diagnoses:  Sore throat  Viral URI with cough     Discharge Instructions     Take plain guaifenesin (1200mg  extended release tabs such as Mucinex) twice daily, with plenty of water, for cough and congestion.  May add Pseudoephedrine (30mg , one or two every 4 to 6 hours) for sinus congestion.  Get adequate rest.   For increased sinus congestion, may use Afrin nasal spray (or generic oxymetazoline) each morning for about 5 days and then discontinue.  Also recommend using saline nasal spray several times daily and saline nasal irrigation (AYR is a common brand).  Use Flonase nasal spray each morning after using Afrin nasal spray and saline nasal  irrigation. Try warm salt water gargles for sore throat.  Stop all antihistamines (Zyrtec, etc) for now, and other non-prescription cough/cold preparations. May take Delsym Cough Suppressant at bedtime for nighttime cough.  Begin Azithromycin if not improving about one week or if persistent fever develops (Given a prescription to hold, with an expiration date)      ED Prescriptions  Medication Sig Dispense Auth. Provider   predniSONE (DELTASONE) 20 MG tablet Take one tab by mouth twice daily for 4 days, then one daily for 3 days. Take with food. 11 tablet Kandra Nicolas, MD   azithromycin (ZITHROMAX Z-PAK) 250 MG tablet Take 2 tabs today; then begin one tab once daily for 4 more days. 6 tablet Kandra Nicolas, MD        Kandra Nicolas, MD 08/24/19 1021

## 2019-08-25 LAB — STREP A DNA PROBE: Group A Strep Probe: NOT DETECTED

## 2019-10-22 ENCOUNTER — Emergency Department
Admission: EM | Admit: 2019-10-22 | Discharge: 2019-10-22 | Disposition: A | Discharge status: Home / Self Care | Payer: BC Managed Care – PPO | Source: Home / Self Care

## 2019-10-22 ENCOUNTER — Other Ambulatory Visit: Payer: Self-pay

## 2019-10-22 DIAGNOSIS — K644 Residual hemorrhoidal skin tags: Secondary | ICD-10-CM | POA: Diagnosis not present

## 2019-10-22 MED ORDER — HYDROCORTISONE ACETATE 25 MG RE SUPP: 25.0000 mg | Freq: Two times a day (BID) | RECTAL | 0 refills | Status: DC

## 2019-10-22 NOTE — ED Provider Notes (Signed)
[facility]    CSN: [id number] Arrival date & time: 10/22/19  1417      History   Chief Complaint Chief Complaint  Patient presents with  . Hemorrhoids    HPI Kent Hammond is a 49 y.o. male.   HPI  Kent Hammond is a 49 y.o. male presenting to UC with c/o hemorrhoid pain for about 2 days.  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 Pain is 2/10, worse with prolonged sitting on his hard seat.  He has used OTC preparation H, with moderate relief.  No rectal bleeding or recent constipation.    Past Medical History:  Diagnosis Date  . Asthmatic bronchitis     Patient Active Problem List   Diagnosis Date Noted  . Sore throat 08/24/2019  . Umbilical hernia without obstruction and without gangrene 09/19/2017  . Asthma, chronic 06/17/2014  . Pigmented skin lesion 06/17/2014  . Seborrheic keratoses 06/17/2014    History reviewed. No pertinent surgical history.     Home Medications    Prior to Admission medications   Medication Sig Start Date End Date Taking? Authorizing Provider  azithromycin (ZITHROMAX Z-PAK) 250 MG tablet Take 2 tabs today; then begin one tab once daily for 4 more days. 08/24/19   Lattie Haw, MD  hydrocortisone (ANUSOL-HC) 25 MG suppository Place 1 suppository (25 mg total) rectally 2 (two) times daily. For 7 days 10/22/19   Lurene Shadow, PA-C  predniSONE (DELTASONE) 20 MG tablet Take one tab by mouth twice daily for 4 days, then one daily for 3 days. Take with food. 08/24/19   Lattie Haw, MD  albuterol (PROVENTIL HFA;VENTOLIN HFA) 108 (90 Base) MCG/ACT inhaler Inhale 2 puffs into the lungs every 4 (four) hours as needed for wheezing or shortness of breath. 03/01/18 08/24/19  Carlis Stable, PA-C    Family History Family History  Problem Relation Age of Onset  . Cancer Father        brain cancer, unsure if complication from [place]   .  Alcoholism Other   . Heart attack Other        uncles  . Hyperlipidemia Mother     Social History Social History   Tobacco Use  . Smoking status: Former Smoker    Quit date: 10/16/2005    Years since quitting: 14.0  . Smokeless tobacco: Current User    Types: Chew  Substance Use Topics  . Alcohol use: Yes    Alcohol/week: 1.0 standard drink    Types: 1 Standard drinks or equivalent per week    Comment: sundays only  . Drug use: No     Allergies   Shellfish allergy   Review of Systems Review of Systems  Constitutional: Negative for chills and fever.  Gastrointestinal: Positive for rectal pain. Negative for abdominal pain, anal bleeding, blood in stool, constipation, diarrhea, nausea and vomiting.  Genitourinary: Negative for dysuria, penile pain, scrotal swelling and testicular pain.     Physical Exam Triage Vital Signs ED Triage Vitals  Enc Vitals Group     BP 10/22/19 1441 (!) 156/94     Pulse Rate 10/22/19 1441 87     Resp 10/22/19 1441 16     Temp 10/22/19 1441 98.7 F (37.1 C)     Temp Source 10/22/19 1441 Oral     SpO2 10/22/19 1441 98 %     Weight --  Height --      Head Circumference --      Peak Flow --      Pain Score 10/22/19 1439 2     Pain Loc --      Pain Edu? --      Excl. in GC? --    No data found.  Updated Vital Signs BP (!) 156/94 (BP Location: Right Arm)   Pulse 87   Temp 98.7 F (37.1 C) (Oral)   Resp 16   SpO2 98%   Visual Acuity Right Eye Distance:   Left Eye Distance:   Bilateral Distance:    Right Eye Near:   Left Eye Near:    Bilateral Near:     Physical Exam Vitals and nursing note reviewed. Exam conducted with a chaperone present.  Constitutional:      Appearance: Normal appearance. He is well-developed.  HENT:     Head: Normocephalic and atraumatic.  Cardiovascular:     Rate and Rhythm: Normal rate.  Pulmonary:     Effort: Pulmonary effort is normal.  Genitourinary:    Rectum: Tenderness (mild) and  external hemorrhoid ( no bleeding, not thrombosed ) present. No internal hemorrhoid. Normal anal tone.  Musculoskeletal:        General: Normal range of motion.     Cervical back: Normal range of motion.  Skin:    General: Skin is warm and dry.  Neurological:     Mental Status: He is alert and oriented to person, place, and time.  Psychiatric:        Behavior: Behavior normal.      UC Treatments / Results  Labs (all labs ordered are listed, but only abnormal results are displayed) Labs Reviewed - No data to display  EKG   Radiology No results found.  Procedures Procedures (including critical care time)  Medications Ordered in UC Medications - No data to display  Initial Impression / Assessment and Plan / UC Course  I have reviewed the triage vital signs and the nursing notes.  Pertinent labs & imaging results that were available during my care of the patient were reviewed by me and considered in my medical decision making (see chart for details).     External hemorrhoid without complication, will have pt try conservative tx F/u with PCP or GI later this week if needed AVS and work note provided  Final Clinical Impressions(s) / UC Diagnoses   Final diagnoses:  External hemorrhoid     Discharge Instructions      You may use the medication prescribed as well as taking over the counter stool softeners, Sitz baths, and using a cushion specialized to help relieve pressure from rectal area (can be found online or some pharmacies) to help with your discomfort.   Call to schedule a follow up exam with your primary care provider or a gastrointestinal specialist for further evaluation and treatment.     ED Prescriptions    Medication Sig Dispense Auth. Provider   hydrocortisone (ANUSOL-HC) 25 MG suppository Place 1 suppository (25 mg total) rectally 2 (two) times daily. For 7 days 14 suppository Lurene Shadow, [place]     PDMP not reviewed this encounter.   Lurene Shadow, [place] 10/22/19 1638

## 2019-10-22 NOTE — Discharge Instructions (Signed)
  You may use the medication prescribed as well as taking over the counter stool softeners, Sitz baths, and using a cushion specialized to help relieve pressure from rectal area (can be found online or some pharmacies) to help with your discomfort.   Call to schedule a follow up exam with your primary care provider or a gastrointestinal specialist for further evaluation and treatment.

## 2019-10-22 NOTE — ED Triage Notes (Signed)
Patient presents to Urgent Care with complaints of hemorrhoids since two days ago. 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

## 2020-02-05 ENCOUNTER — Ambulatory Visit (INDEPENDENT_AMBULATORY_CARE_PROVIDER_SITE_OTHER): Payer: BC Managed Care – PPO | Admitting: Osteopathic Medicine

## 2020-02-05 ENCOUNTER — Encounter: Payer: Self-pay | Admitting: Osteopathic Medicine

## 2020-02-05 VITALS — BP 143/86 | HR 60 | Temp 97.6°F | Wt 331.1 lb

## 2020-02-05 DIAGNOSIS — R7301 Impaired fasting glucose: Secondary | ICD-10-CM

## 2020-02-05 DIAGNOSIS — Z0000 Encounter for general adult medical examination without abnormal findings: Secondary | ICD-10-CM

## 2020-02-05 DIAGNOSIS — R222 Localized swelling, mass and lump, trunk: Secondary | ICD-10-CM

## 2020-02-05 DIAGNOSIS — K635 Polyp of colon: Secondary | ICD-10-CM | POA: Diagnosis not present

## 2020-02-05 DIAGNOSIS — R030 Elevated blood-pressure reading, without diagnosis of hypertension: Secondary | ICD-10-CM

## 2020-02-05 NOTE — Patient Instructions (Addendum)
General Preventive Care  Most recent routine screening labs: ordered today.   Blood pressure goal 130/80 or less.  BP Readings from Last 3 Encounters:  02/05/20 (!) 143/86  10/22/19 (!) 156/94  08/24/19 (!) 145/87    Tobacco: don't!   Alcohol: responsible moderation is ok for most adults - if you have concerns about your alcohol intake, please talk to me!   Exercise: as tolerated to reduce risk of cardiovascular disease and diabetes. Strength training will also prevent osteoporosis.   Mental health: if need for mental health care (medicines, counseling, other), or concerns about moods, please let me know!   Sexual / Reproductive health: if need for STD testing, or if concerns with libido/pain problems, please let me know!   Advanced Directive: Living Will and/or Healthcare Power of Attorney recommended for all adults, regardless of age or health.  Vaccines  Flu vaccine: for almost everyone, every fall.   Tetanus booster: Tdap due 09/2027  Shingles vaccine: Shingrix recommended after age 49.   Pneumonia vaccines: Pneumovax after age 49  COVID vaccine: STRONGLY RECOMMENDED  Cancer screenings   Colon cancer screening: Colonoscopy follow-up per GI  Prostate cancer screening: PSA blood test age 49-71  Lung cancer screening: CT chest every year for those aged 49 to 4180 years who have a 20 pack-year smoking history and currently smoke or have quit within the past 15 years. Infection screenings  . HIV: recommended screening at least once age 49-65, more often as needed. . Gonorrhea/Chlamydia: screening as needed . Hepatitis C: recommended once for everyone age 49-75 . TB: certain at-risk populations, or depending on work requirements and/or travel history Other . Bone Density Test: recommended for men at age 49 . Abdominal Aortic Aneurysm: screening with ultrasound recommended once for men age 49-75 who have ever smoked          DASH Eating Plan DASH stands for  "Dietary Approaches to Stop Hypertension." The DASH eating plan is a healthy eating plan that has been shown to reduce high blood pressure (hypertension). It may also reduce your risk for type 2 diabetes, heart disease, and stroke. The DASH eating plan may also help with weight loss. What are tips for following this plan?  General guidelines  Avoid eating more than 2,300 mg (milligrams) of salt (sodium) a day. If you have hypertension, you may need to reduce your sodium intake to 1,500 mg a day.  Limit alcohol intake to no more than 1 drink a day for nonpregnant women and 2 drinks a day for men. One drink equals 12 oz of beer, 5 oz of wine, or 1 oz of hard liquor.  Work with your health care provider to maintain a healthy body weight or to lose weight. Ask what an ideal weight is for you.  Get at least 30 minutes of exercise that causes your heart to beat faster (aerobic exercise) most days of the week. Activities may include walking, swimming, or biking.  Work with your health care provider or diet and nutrition specialist (dietitian) to adjust your eating plan to your individual calorie needs. Reading food labels   Check food labels for the amount of sodium per serving. Choose foods with less than 5 percent of the Daily Value of sodium. Generally, foods with less than 300 mg of sodium per serving fit into this eating plan.  To find whole grains, look for the word "whole" as the first word in the ingredient list. Shopping  Buy products labeled as "low-sodium" or "  no salt added."  Buy fresh foods. Avoid canned foods and premade or frozen meals. Cooking  Avoid adding salt when cooking. Use salt-free seasonings or herbs instead of table salt or sea salt. Check with your health care provider or pharmacist before using salt substitutes.  Do not fry foods. Cook foods using healthy methods such as baking, boiling, grilling, and broiling instead.  Cook with heart-healthy oils, such as olive,  canola, soybean, or sunflower oil. Meal planning  Eat a balanced diet that includes: ? 5 or more servings of fruits and vegetables each day. At each meal, try to fill half of your plate with fruits and vegetables. ? Up to 6-8 servings of whole grains each day. ? Less than 6 oz of lean meat, poultry, or fish each day. A 3-oz serving of meat is about the same size as a deck of cards. One egg equals 1 oz. ? 2 servings of low-fat dairy each day. ? A serving of nuts, seeds, or beans 5 times each week. ? Heart-healthy fats. Healthy fats called Omega-3 fatty acids are found in foods such as flaxseeds and coldwater fish, like sardines, salmon, and mackerel.  Limit how much you eat of the following: ? Canned or prepackaged foods. ? Food that is high in trans fat, such as fried foods. ? Food that is high in saturated fat, such as fatty meat. ? Sweets, desserts, sugary drinks, and other foods with added sugar. ? Full-fat dairy products.  Do not salt foods before eating.  Try to eat at least 2 vegetarian meals each week.  Eat more home-cooked food and less restaurant, buffet, and fast food.  When eating at a restaurant, ask that your food be prepared with less salt or no salt, if possible. What foods are recommended? The items listed may not be a complete list. Talk with your dietitian about what dietary choices are best for you. Grains Whole-grain or whole-wheat bread. Whole-grain or whole-wheat pasta. Brown rice. Orpah Cobb. Bulgur. Whole-grain and low-sodium cereals. Pita bread. Low-fat, low-sodium crackers. Whole-wheat flour tortillas. Vegetables Fresh or frozen vegetables (raw, steamed, roasted, or grilled). Low-sodium or reduced-sodium tomato and vegetable juice. Low-sodium or reduced-sodium tomato sauce and tomato paste. Low-sodium or reduced-sodium canned vegetables. Fruits All fresh, dried, or frozen fruit. Canned fruit in natural juice (without added sugar). Meat and other protein  foods Skinless chicken or [place]. Ground chicken or [place]. Pork with fat trimmed off. Fish and seafood. Egg whites. Dried beans, peas, or lentils. Unsalted nuts, nut butters, and seeds. Unsalted canned beans. Lean cuts of beef with fat trimmed off. Low-sodium, lean deli meat. Dairy Low-fat (1%) or fat-free (skim) milk. Fat-free, low-fat, or reduced-fat cheeses. Nonfat, low-sodium ricotta or cottage cheese. Low-fat or nonfat yogurt. Low-fat, low-sodium cheese. Fats and oils Soft margarine without trans fats. Vegetable oil. Low-fat, reduced-fat, or light mayonnaise and salad dressings (reduced-sodium). Canola, safflower, olive, soybean, and sunflower oils. Avocado. Seasoning and other foods Herbs. Spices. Seasoning mixes without salt. Unsalted popcorn and pretzels. Fat-free sweets. What foods are not recommended? The items listed may not be a complete list. Talk with your dietitian about what dietary choices are best for you. Grains Baked goods made with fat, such as croissants, muffins, or some breads. Dry pasta or rice meal packs. Vegetables Creamed or fried vegetables. Vegetables in a cheese sauce. Regular canned vegetables (not low-sodium or reduced-sodium). Regular canned tomato sauce and paste (not low-sodium or reduced-sodium). Regular tomato and vegetable juice (not low-sodium or reduced-sodium). Rosita Fire. Olives. Fruits  Canned fruit in a light or heavy syrup. Fried fruit. Fruit in cream or butter sauce. Meat and other protein foods Fatty cuts of meat. Ribs. Fried meat. Tomasa Blase. Sausage. Bologna and other processed lunch meats. Salami. Fatback. Hotdogs. Bratwurst. Salted nuts and seeds. Canned beans with added salt. Canned or smoked fish. Whole eggs or egg yolks. Chicken or [place] with skin. Dairy Whole or 2% milk, cream, and half-and-half. Whole or full-fat cream cheese. Whole-fat or sweetened yogurt. Full-fat cheese. Nondairy creamers. Whipped toppings. Processed cheese and cheese  spreads. Fats and oils Butter. Stick margarine. Lard. Shortening. Ghee. Bacon fat. Tropical oils, such as coconut, palm kernel, or palm oil. Seasoning and other foods Salted popcorn and pretzels. Onion salt, garlic salt, seasoned salt, table salt, and sea salt. Worcestershire sauce. Tartar sauce. Barbecue sauce. Teriyaki sauce. Soy sauce, including reduced-sodium. Steak sauce. Canned and packaged gravies. Fish sauce. Oyster sauce. Cocktail sauce. Horseradish that you find on the shelf. Ketchup. Mustard. Meat flavorings and tenderizers. Bouillon cubes. Hot sauce and Tabasco sauce. Premade or packaged marinades. Premade or packaged taco seasonings. Relishes. Regular salad dressings. Where to find more information:  National Heart, Lung, and Blood Institute: PopSteam.is  American Heart Association: www.heart.org Summary  The DASH eating plan is a healthy eating plan that has been shown to reduce high blood pressure (hypertension). It may also reduce your risk for type 2 diabetes, heart disease, and stroke.  With the DASH eating plan, you should limit salt (sodium) intake to 2,300 mg a day. If you have hypertension, you may need to reduce your sodium intake to 1,500 mg a day.  When on the DASH eating plan, aim to eat more fresh fruits and vegetables, whole grains, lean proteins, low-fat dairy, and heart-healthy fats.  Work with your health care provider or diet and nutrition specialist (dietitian) to adjust your eating plan to your individual calorie needs. This information is not intended to replace advice given to you by your health care provider. Make sure you discuss any questions you have with your health care provider. Document Revised: 03/04/2017 Document Reviewed: 03/15/2016 Elsevier Patient Education  2020 Else[employer] Inc.      Preventing Hypertension Hypertension, commonly called high blood pressure, is when the force of blood pumping through the arteries is too strong. Arteries  are blood vessels that carry blood from the heart throughout the body. Over time, hypertension can damage the arteries and decrease blood flow to important parts of the body, including the brain, heart, and kidneys. Often, hypertension does not cause symptoms until blood pressure is very high. For this reason, it is important to have your blood pressure checked on a regular basis. Hypertension can often be prevented with diet and lifestyle changes. If you already have hypertension, you can control it with diet and lifestyle changes, as well as medicine. What nutrition changes can be made? Maintain a healthy diet. This includes:  Eating less salt (sodium). Ask your health care provider how much sodium is safe for you to have. The general recommendation is to consume less than 1 tsp (2,300 mg) of sodium a day. ? Do not add salt to your food. ? Choose low-sodium options when grocery shopping and eating out.  Limiting fats in your diet. You can do this by eating low-fat or fat-free dairy products and by eating less red meat.  Eating more fruits, vegetables, and whole grains. Make a goal to eat: ? 1-2 cups of fresh fruits and vegetables each day. ? 3-4 servings of whole  grains each day.  Avoiding foods and beverages that have added sugars.  Eating fish that contain healthy fats (omega-3 fatty acids), such as mackerel or salmon. If you need help putting together a healthy eating plan, try the DASH diet. This diet is high in fruits, vegetables, and whole grains. It is low in sodium, red meat, and added sugars. DASH stands for Dietary Approaches to Stop Hypertension. What lifestyle changes can be made?   Lose weight if you are overweight. Losing just 3?5% of your body weight can help prevent or control hypertension. ? For example, if your present weight is 200 lb (91 kg), a loss of 3-5% of your weight means losing 6-10 lb (2.7-4.5 kg). ? Ask your health care provider to help you with a diet and  exercise plan to safely lose weight.  Get enough exercise. Do at least 150 minutes of moderate-intensity exercise each week. ? You could do this in short exercise sessions several times a day, or you could do longer exercise sessions a few times a week. For example, you could take a brisk 10-minute walk or bike ride, 3 times a day, for 5 days a week.  Find ways to reduce stress, such as exercising, meditating, listening to music, or taking a yoga class. If you need help reducing stress, ask your health care provider.  Do not smoke. This includes e-cigarettes. Chemicals in tobacco and nicotine products raise your blood pressure each time you smoke. If you need help quitting, ask your health care provider.  Avoid alcohol. If you drink alcohol, limit alcohol intake to no more than 1 drink a day for nonpregnant women and 2 drinks a day for men. One drink equals 12 oz of beer, 5 oz of wine, or 1 oz of hard liquor. Why are these changes important? Diet and lifestyle changes can help you prevent hypertension, and they may make you feel better overall and improve your quality of life. If you have hypertension, making these changes will help you control it and help prevent major complications, such as:  Hardening and narrowing of arteries that supply blood to: ? Your heart. This can cause a heart attack. ? Your brain. This can cause a stroke. ? Your kidneys. This can cause kidney failure.  Stress on your heart muscle, which can cause heart failure. What can I do to lower my risk?  Work with your health care provider to make a hypertension prevention plan that works for you. Follow your plan and keep all follow-up visits as told by your health care provider.  Learn how to check your blood pressure at home. Make sure that you know your personal target blood pressure, as told by your health care provider. How is this treated? In addition to diet and lifestyle changes, your health care provider may  recommend medicines to help lower your blood pressure. You may need to try a few different medicines to find what works best for you. You also may need to take more than one medicine. Take over-the-counter and prescription medicines only as told by your health care provider. Where to find support Your health care provider can help you prevent hypertension and help you keep your blood pressure at a healthy level. Your local hospital or your community may also provide support services and prevention programs. The American Heart Association offers an online support network at: https://www.lee.net/ Where to find more information Learn more about hypertension from:  National Heart, Lung, and Blood Institute: https://www.peterson.org/  Centers for  Disease Control and Prevention: AboutHD.co.nz  American Academy of Family Physicians: http://familydoctor.org/familydoctor/en/diseases-conditions/high-blood-pressure.printerview.all.html Learn more about the DASH diet from:  National Heart, Lung, and Blood Institute: WedMap.it Contact a health care provider if:  You think you are having a reaction to medicines you have taken.  You have recurrent headaches or feel dizzy.  You have swelling in your ankles.  You have trouble with your vision. Summary  Hypertension often does not cause any symptoms until blood pressure is very high. It is important to get your blood pressure checked regularly.  Diet and lifestyle changes are the most important steps in preventing hypertension.  By keeping your blood pressure in a healthy range, you can prevent complications like heart attack, heart failure, stroke, and kidney failure.  Work with your health care provider to make a hypertension prevention plan that works for you. This information is not intended to replace advice given to you by your health care  provider. Make sure you discuss any questions you have with your health care provider. Document Revised: 07/14/2018 Document Reviewed: 12/01/2015 Elsevier Patient Education  2020 [employer].

## 2020-02-05 NOTE — Progress Notes (Signed)
HPI: Kent Hammond is a 49 y.o. male who  has a past medical history of Asthmatic bronchitis.  he presents to [facility] today, 02/05/20,  for chief complaint of:  Annual physical     Blood pressure:  BP Readings from Last 3 Encounters:  02/05/20 (!) 143/86  10/22/19 (!) 156/94  08/24/19 (!) 145/87     ASSESSMENT/PLAN: The primary encounter diagnosis was Annual physical exam. Diagnoses of Hyperplastic colonic polyp, unspecified part of colon - Colonoscopy w/ [facility] 12/2019, Elevated fasting glucose, Elevated blood pressure reading, and Mass, chest were also pertinent to this visit.   Elevated BP - pt to work on lifestyle changes and RTC in 3 mos [place] BP rechekc, if still >130/80 will start Rx   Mass on chest lipoma vs cyst, requests referral for removal   Orders Placed This Encounter  Procedures   CBC   Lipid panel   COMPLETE METABOLIC PANEL WITH GFR   Hemoglobin A1c   Ambulatory referral to General Surgery     No orders of the defined types were placed in this encounter.   Patient Instructions   General Preventive Care  Most recent routine screening labs: ordered today.   Blood pressure goal 130/80 or less.  BP Readings from Last 3 Encounters:  02/05/20 (!) 143/86  10/22/19 (!) 156/94  08/24/19 (!) 145/87    Tobacco: don't!   Alcohol: responsible moderation is ok for most adults - if you have concerns about your alcohol intake, please talk to me!   Exercise: as tolerated to reduce risk of cardiovascular disease and diabetes. Strength training will also prevent osteoporosis.   Mental health: if need for mental health care (medicines, counseling, other), or concerns about moods, please let me know!   Sexual / Reproductive health: if need for STD testing, or if concerns with libido/pain problems, please let me know!   Advanced Directive: Living Will and/or Healthcare Power of Attorney recommended for all adults,  regardless of age or health.  Vaccines  Flu vaccine: for almost everyone, every fall.   Tetanus booster: Tdap due 09/2027  Shingles vaccine: Shingrix recommended after age 4.   Pneumonia vaccines: Pneumovax after age 55  COVID vaccine: STRONGLY RECOMMENDED  Cancer screenings   Colon cancer screening: Colonoscopy follow-up per GI  Prostate cancer screening: PSA blood test age 63-71  Lung cancer screening: CT chest every year for those aged 63 to 33 years who have a 20 pack-year smoking history and currently smoke or have quit within the past 15 years. Infection screenings   HIV: recommended screening at least once age 29-65, more often as needed.  Gonorrhea/Chlamydia: screening as needed  Hepatitis C: recommended once for everyone age 81-75  TB: certain at-risk populations, or depending on work requirements and/or travel history Other  Bone Density Test: recommended for men at age 13  Abdominal Aortic Aneurysm: screening with ultrasound recommended once for men age 80-75 who have ever smoked          DASH Eating Plan DASH stands for "Dietary Approaches to Stop Hypertension." The DASH eating plan is a healthy eating plan that has been shown to reduce high blood pressure (hypertension). It may also reduce your risk for type 2 diabetes, heart disease, and stroke. The DASH eating plan may also help with weight loss. What are tips for following this plan?  General guidelines  Avoid eating more than 2,300 mg (milligrams) of salt (sodium) a day. If you have hypertension, you may  need to reduce your sodium intake to 1,500 mg a day.  Limit alcohol intake to no more than 1 drink a day for nonpregnant women and 2 drinks a day for men. One drink equals 12 oz of beer, 5 oz of wine, or 1 oz of hard liquor.  Work with your health care provider to maintain a healthy body weight or to lose weight. Ask what an ideal weight is for you.  Get at least 30 minutes of exercise that  causes your heart to beat faster (aerobic exercise) most days of the week. Activities may include walking, swimming, or biking.  Work with your health care provider or diet and nutrition specialist (dietitian) to adjust your eating plan to your individual calorie needs. Reading food labels   Check food labels for the amount of sodium per serving. Choose foods with less than 5 percent of the Daily Value of sodium. Generally, foods with less than 300 mg of sodium per serving fit into this eating plan.  To find whole grains, look for the word "whole" as the first word in the ingredient list. Shopping  Buy products labeled as "low-sodium" or "no salt added."  Buy fresh foods. Avoid canned foods and premade or frozen meals. Cooking  Avoid adding salt when cooking. Use salt-free seasonings or herbs instead of table salt or sea salt. Check with your health care provider or pharmacist before using salt substitutes.  Do not fry foods. Cook foods using healthy methods such as baking, boiling, grilling, and broiling instead.  Cook with heart-healthy oils, such as olive, canola, soybean, or sunflower oil. Meal planning  Eat a balanced diet that includes: ? 5 or more servings of fruits and vegetables each day. At each meal, try to fill half of your plate with fruits and vegetables. ? Up to 6-8 servings of whole grains each day. ? Less than 6 oz of lean meat, poultry, or fish each day. A 3-oz serving of meat is about the same size as a deck of cards. One egg equals 1 oz. ? 2 servings of low-fat dairy each day. ? A serving of nuts, seeds, or beans 5 times each week. ? Heart-healthy fats. Healthy fats called Omega-3 fatty acids are found in foods such as flaxseeds and coldwater fish, like sardines, salmon, and mackerel.  Limit how much you eat of the following: ? Canned or prepackaged foods. ? Food that is high in trans fat, such as fried foods. ? Food that is high in saturated fat, such as fatty  meat. ? Sweets, desserts, sugary drinks, and other foods with added sugar. ? Full-fat dairy products.  Do not salt foods before eating.  Try to eat at least 2 vegetarian meals each week.  Eat more home-cooked food and less restaurant, buffet, and fast food.  When eating at a restaurant, ask that your food be prepared with less salt or no salt, if possible. What foods are recommended? The items listed may not be a complete list. Talk with your dietitian about what dietary choices are best for you. Grains Whole-grain or whole-wheat bread. Whole-grain or whole-wheat pasta. Brown rice. Orpah Cobbatmeal. Quinoa. Bulgur. Whole-grain and low-sodium cereals. Pita bread. Low-fat, low-sodium crackers. Whole-wheat flour tortillas. Vegetables Fresh or frozen vegetables (raw, steamed, roasted, or grilled). Low-sodium or reduced-sodium tomato and vegetable juice. Low-sodium or reduced-sodium tomato sauce and tomato paste. Low-sodium or reduced-sodium canned vegetables. Fruits All fresh, dried, or frozen fruit. Canned fruit in natural juice (without added sugar). Meat and other protein  foods Skinless chicken or [place]. Ground chicken or [place]. Pork with fat trimmed off. Fish and seafood. Egg whites. Dried beans, peas, or lentils. Unsalted nuts, nut butters, and seeds. Unsalted canned beans. Lean cuts of beef with fat trimmed off. Low-sodium, lean deli meat. Dairy Low-fat (1%) or fat-free (skim) milk. Fat-free, low-fat, or reduced-fat cheeses. Nonfat, low-sodium ricotta or cottage cheese. Low-fat or nonfat yogurt. Low-fat, low-sodium cheese. Fats and oils Soft margarine without trans fats. Vegetable oil. Low-fat, reduced-fat, or light mayonnaise and salad dressings (reduced-sodium). Canola, safflower, olive, soybean, and sunflower oils. Avocado. Seasoning and other foods Herbs. Spices. Seasoning mixes without salt. Unsalted popcorn and pretzels. Fat-free sweets. What foods are not recommended? The items listed  may not be a complete list. Talk with your dietitian about what dietary choices are best for you. Grains Baked goods made with fat, such as croissants, muffins, or some breads. Dry pasta or rice meal packs. Vegetables Creamed or fried vegetables. Vegetables in a cheese sauce. Regular canned vegetables (not low-sodium or reduced-sodium). Regular canned tomato sauce and paste (not low-sodium or reduced-sodium). Regular tomato and vegetable juice (not low-sodium or reduced-sodium). Rosita Fire. Olives. Fruits Canned fruit in a light or heavy syrup. Fried fruit. Fruit in cream or butter sauce. Meat and other protein foods Fatty cuts of meat. Ribs. Fried meat. Tomasa Blase. Sausage. Bologna and other processed lunch meats. Salami. Fatback. Hotdogs. Bratwurst. Salted nuts and seeds. Canned beans with added salt. Canned or smoked fish. Whole eggs or egg yolks. Chicken or [place] with skin. Dairy Whole or 2% milk, cream, and half-and-half. Whole or full-fat cream cheese. Whole-fat or sweetened yogurt. Full-fat cheese. Nondairy creamers. Whipped toppings. Processed cheese and cheese spreads. Fats and oils Butter. Stick margarine. Lard. Shortening. Ghee. Bacon fat. Tropical oils, such as coconut, palm kernel, or palm oil. Seasoning and other foods Salted popcorn and pretzels. Onion salt, garlic salt, seasoned salt, table salt, and sea salt. Worcestershire sauce. Tartar sauce. Barbecue sauce. Teriyaki sauce. Soy sauce, including reduced-sodium. Steak sauce. Canned and packaged gravies. Fish sauce. Oyster sauce. Cocktail sauce. Horseradish that you find on the shelf. Ketchup. Mustard. Meat flavorings and tenderizers. Bouillon cubes. Hot sauce and Tabasco sauce. Premade or packaged marinades. Premade or packaged taco seasonings. Relishes. Regular salad dressings. Where to find more information:  National Heart, Lung, and Blood Institute: PopSteam.is  American Heart Association: www.heart.org Summary  The DASH  eating plan is a healthy eating plan that has been shown to reduce high blood pressure (hypertension). It may also reduce your risk for type 2 diabetes, heart disease, and stroke.  With the DASH eating plan, you should limit salt (sodium) intake to 2,300 mg a day. If you have hypertension, you may need to reduce your sodium intake to 1,500 mg a day.  When on the DASH eating plan, aim to eat more fresh fruits and vegetables, whole grains, lean proteins, low-fat dairy, and heart-healthy fats.  Work with your health care provider or diet and nutrition specialist (dietitian) to adjust your eating plan to your individual calorie needs. This information is not intended to replace advice given to you by your health care provider. Make sure you discuss any questions you have with your health care provider. Document Revised: 03/04/2017 Document Reviewed: 03/15/2016 Elsevier Patient Education  2020 Elsevier Inc.      Preventing Hypertension Hypertension, commonly called high blood pressure, is when the force of blood pumping through the arteries is too strong. Arteries are blood vessels that carry blood from the heart throughout  the body. Over time, hypertension can damage the arteries and decrease blood flow to important parts of the body, including the brain, heart, and kidneys. Often, hypertension does not cause symptoms until blood pressure is very high. For this reason, it is important to have your blood pressure checked on a regular basis. Hypertension can often be prevented with diet and lifestyle changes. If you already have hypertension, you can control it with diet and lifestyle changes, as well as medicine. What nutrition changes can be made? Maintain a healthy diet. This includes:  Eating less salt (sodium). Ask your health care provider how much sodium is safe for you to have. The general recommendation is to consume less than 1 tsp (2,300 mg) of sodium a day. ? Do not add salt to your  food. ? Choose low-sodium options when grocery shopping and eating out.  Limiting fats in your diet. You can do this by eating low-fat or fat-free dairy products and by eating less red meat.  Eating more fruits, vegetables, and whole grains. Make a goal to eat: ? 1-2 cups of fresh fruits and vegetables each day. ? 3-4 servings of whole grains each day.  Avoiding foods and beverages that have added sugars.  Eating fish that contain healthy fats (omega-3 fatty acids), such as mackerel or salmon. If you need help putting together a healthy eating plan, try the DASH diet. This diet is high in fruits, vegetables, and whole grains. It is low in sodium, red meat, and added sugars. DASH stands for Dietary Approaches to Stop Hypertension. What lifestyle changes can be made?   Lose weight if you are overweight. Losing just 3?5% of your body weight can help prevent or control hypertension. ? For example, if your present weight is 200 lb (91 kg), a loss of 3-5% of your weight means losing 6-10 lb (2.7-4.5 kg). ? Ask your health care provider to help you with a diet and exercise plan to safely lose weight.  Get enough exercise. Do at least 150 minutes of moderate-intensity exercise each week. ? You could do this in short exercise sessions several times a day, or you could do longer exercise sessions a few times a week. For example, you could take a brisk 10-minute walk or bike ride, 3 times a day, for 5 days a week.  Find ways to reduce stress, such as exercising, meditating, listening to music, or taking a yoga class. If you need help reducing stress, ask your health care provider.  Do not smoke. This includes e-cigarettes. Chemicals in tobacco and nicotine products raise your blood pressure each time you smoke. If you need help quitting, ask your health care provider.  Avoid alcohol. If you drink alcohol, limit alcohol intake to no more than 1 drink a day for nonpregnant women and 2 drinks a day for  men. One drink equals 12 oz of beer, 5 oz of wine, or 1 oz of hard liquor. Why are these changes important? Diet and lifestyle changes can help you prevent hypertension, and they may make you feel better overall and improve your quality of life. If you have hypertension, making these changes will help you control it and help prevent major complications, such as:  Hardening and narrowing of arteries that supply blood to: ? Your heart. This can cause a heart attack. ? Your brain. This can cause a stroke. ? Your kidneys. This can cause kidney failure.  Stress on your heart muscle, which can cause heart failure. What can I  do to lower my risk?  Work with your health care provider to make a hypertension prevention plan that works for you. Follow your plan and keep all follow-up visits as told by your health care provider.  Learn how to check your blood pressure at home. Make sure that you know your personal target blood pressure, as told by your health care provider. How is this treated? In addition to diet and lifestyle changes, your health care provider may recommend medicines to help lower your blood pressure. You may need to try a few different medicines to find what works best for you. You also may need to take more than one medicine. Take over-the-counter and prescription medicines only as told by your health care provider. Where to find support Your health care provider can help you prevent hypertension and help you keep your blood pressure at a healthy level. Your local hospital or your community may also provide support services and prevention programs. The American Heart Association offers an online support network at: https://www.lee.net/ Where to find more information Learn more about hypertension from:  National Heart, Lung, and Blood Institute: https://www.peterson.org/  Centers for Disease Control and Prevention:  AboutHD.co.nz  American Academy of Family Physicians: http://familydoctor.org/familydoctor/en/diseases-conditions/high-blood-pressure.printerview.all.html Learn more about the DASH diet from:  National Heart, Lung, and Blood Institute: WedMap.it Contact a health care provider if:  You think you are having a reaction to medicines you have taken.  You have recurrent headaches or feel dizzy.  You have swelling in your ankles.  You have trouble with your vision. Summary  Hypertension often does not cause any symptoms until blood pressure is very high. It is important to get your blood pressure checked regularly.  Diet and lifestyle changes are the most important steps in preventing hypertension.  By keeping your blood pressure in a healthy range, you can prevent complications like heart attack, heart failure, stroke, and kidney failure.  Work with your health care provider to make a hypertension prevention plan that works for you. This information is not intended to replace advice given to you by your health care provider. Make sure you discuss any questions you have with your health care provider. Document Revised: 07/14/2018 Document Reviewed: 12/01/2015 Elsevier Patient Education  2020 [employer].      Follow-up plan: Return in about 3 months (around 05/07/2020) for NURSE VISIT BP CHECK .                                                 ################################################# ################################################# ################################################# #################################################    No outpatient medications have been marked as taking for the 02/05/20 encounter (Office Visit) with Sunnie Nielsen, DO.    Allergies  Allergen Reactions   Other Anaphylaxis   Shellfish Allergy Swelling    Lips tingling, trouble  swallowing       Review of Systems: Pertinent (+) and (-) ROS in HPI as above   Exam:  BP (!) 143/86 (BP Location: Right Arm, Patient Position: Sitting, Cuff Size: Large)    Pulse 60    Temp 97.6 F (36.4 C) (Oral)    Wt (!) 331 lb 1.9 oz (150.2 kg)    BMI 38.26 kg/m   Constitutional: VS see above. General Appearance: alert, well-developed, well-nourished, NAD  Neck: No masses, trachea midline.   Respiratory: Normal respiratory effort. no wheeze, no rhonchi, no rales  Cardiovascular: S1/S2 normal, no murmur, no rub/gallop auscultated. RRR.   Musculoskeletal: Gait normal. Symmetric and independent movement of all extremities  Abdominal: non-tender, non-distended, no appreciable organomegaly, neg Murphy's, BS WNLx4  Neurological: Normal balance/coordination. No tremor.  Skin: warm, dry, intact.   Psychiatric: Normal judgment/insight. Normal mood and affect. Oriented x3.       Visit summary with medication list and pertinent instructions was printed for patient to review, patient was advised to alert [place] if any updates are needed. All questions at time of visit were answered - patient instructed to contact office with any additional concerns. ER/RTC precautions were reviewed with the patient and understanding verbalized.        Please note: voice recognition software was used to produce this document, and typos may escape review. Please contact Dr. Lyn Hollingshead for any needed clarifications.    Follow up plan: Return in about 3 months (around 05/07/2020) for NURSE VISIT BP CHECK .

## 2020-02-06 LAB — COMPLETE METABOLIC PANEL WITH GFR
AG Ratio: 1.8 (calc) (ref 1.0–2.5)
ALT: 36 U/L (ref 9–46)
AST: 22 U/L (ref 10–40)
Albumin: 4.4 g/dL (ref 3.6–5.1)
Alkaline phosphatase (APISO): 115 U/L (ref 36–130)
BUN: 9 mg/dL (ref 7–25)
CO2: 29 mmol/L (ref 20–32)
Calcium: 9.5 mg/dL (ref 8.6–10.3)
Chloride: 103 mmol/L (ref 98–110)
Creat: 1 mg/dL (ref 0.60–1.35)
GFR, Est African American: 102 mL/min/{1.73_m2} (ref >=60)
GFR, Est Non African American: 88 mL/min/{1.73_m2} (ref >=60)
Globulin: 2.5 g/dL (calc) (ref 1.9–3.7)
Glucose, Bld: 96 mg/dL (ref 65–99)
Potassium: 4.7 mmol/L (ref 3.5–5.3)
Sodium: 140 mmol/L (ref 135–146)
Total Bilirubin: 0.6 mg/dL (ref 0.2–1.2)
Total Protein: 6.9 g/dL (ref 6.1–8.1)

## 2020-02-06 LAB — CBC
HCT: 46.2 % (ref 38.5–50.0)
Hemoglobin: 16 g/dL (ref 13.2–17.1)
MCH: 30.5 pg (ref 27.0–33.0)
MCHC: 34.6 g/dL (ref 32.0–36.0)
MCV: 88.2 fL (ref 80.0–100.0)
MPV: 13.1 fL — ABNORMAL HIGH (ref 7.5–12.5)
Platelets: 161 10*3/uL (ref 140–400)
RBC: 5.24 10*6/uL (ref 4.20–5.80)
RDW: 12.2 % (ref 11.0–15.0)
WBC: 6.9 10*3/uL (ref 3.8–10.8)

## 2020-02-06 LAB — LIPID PANEL
Cholesterol: 203 mg/dL — ABNORMAL HIGH (ref <200)
HDL: 43 mg/dL (ref >=40)
LDL Cholesterol (Calc): 133 mg/dL (calc) — ABNORMAL HIGH
Non-HDL Cholesterol (Calc): 160 mg/dL (calc) — ABNORMAL HIGH (ref <130)
Total CHOL/HDL Ratio: 4.7 (calc) (ref <5.0)
Triglycerides: 145 mg/dL (ref <150)

## 2020-02-06 LAB — HEMOGLOBIN A1C
Hgb A1c MFr Bld: 5.5 % of total Hgb (ref <5.7)
Mean Plasma Glucose: 111 (calc)
eAG (mmol/L): 6.2 (calc)

## 2020-03-25 ENCOUNTER — Telehealth: Payer: Self-pay

## 2020-03-25 NOTE — Telephone Encounter (Signed)
Noted  

## 2020-03-25 NOTE — Telephone Encounter (Signed)
Pt called stating that he had a pre-surgical Covid test on 03/24/20. Results were positive. Per pt, denies any symptoms. He mentioned he feels great. Pt wanted provider to be aware.

## 2020-05-07 ENCOUNTER — Other Ambulatory Visit: Payer: Self-pay

## 2020-05-07 ENCOUNTER — Ambulatory Visit (INDEPENDENT_AMBULATORY_CARE_PROVIDER_SITE_OTHER): Payer: BC Managed Care – PPO | Admitting: Osteopathic Medicine

## 2020-05-07 VITALS — BP 141/99 | HR 74

## 2020-05-07 DIAGNOSIS — I10 Essential (primary) hypertension: Secondary | ICD-10-CM

## 2020-05-07 NOTE — Progress Notes (Signed)
Pt here this morning for BP check.  Initial reading was 141/99.  After sitting for 10 min the reading came down to 137/74.  Denies chest pain, SOB, headaches. Really adamant about not wanting to start medications.

## 2020-05-07 NOTE — Progress Notes (Signed)
BP Readings from Last 3 Encounters:  05/07/20 (!) 141/99  02/05/20 (!) 143/86  10/22/19 (!) 156/94   If were going by the numbers, patient really should be on antihypertensives to prevent long-term risk of heart attack/stroke.  It is of course his decision and he has been educated on the risks versus benefits of medication versus uncontrolled hypertension.  Can continue to follow with blood pressure checks in the office every 6 months, just in case blood pressure rises significantly such that I would really have to insist on blood pressure medications

## 2020-05-08 NOTE — Progress Notes (Signed)
Pt notified of provider instructions and was transferred to scheduling to make a 6 month BP follow up.

## 2020-11-05 ENCOUNTER — Ambulatory Visit: Payer: BC Managed Care – PPO | Admitting: Osteopathic Medicine

## 2020-11-10 ENCOUNTER — Ambulatory Visit: Payer: BC Managed Care – PPO | Admitting: Osteopathic Medicine

## 2020-11-24 ENCOUNTER — Emergency Department
Admission: EM | Admit: 2020-11-24 | Discharge: 2020-11-24 | Disposition: A | Discharge status: Home / Self Care | Payer: BC Managed Care – PPO | Source: Home / Self Care

## 2020-11-24 ENCOUNTER — Other Ambulatory Visit: Payer: Self-pay

## 2020-11-24 DIAGNOSIS — B070 Plantar wart: Secondary | ICD-10-CM

## 2020-11-24 DIAGNOSIS — M79675 Pain in left toe(s): Secondary | ICD-10-CM

## 2020-11-24 NOTE — Discharge Instructions (Addendum)
Advised/encouraged patient to discontinue OTC wart removal so that toe wound can be evaluated without change of topography of left great toe lesion.

## 2020-11-24 NOTE — ED Triage Notes (Signed)
Pt presents to Urgent Care with c/o L great toe pain d/t suspected wart on the tip of toe. States he noticed this approx one week ago; noticed a "black spot" which he presumed was a splinter. States wife removed it w/ fingernail clippers, but lesion came back. Has been using OTC wart removal products.

## 2020-11-24 NOTE — ED Provider Notes (Signed)
[facility]    CSN: [id number] Arrival date & time: 11/24/20  6808      History   Chief Complaint Chief Complaint  Patient presents with   Toe Pain    L great toe    HPI Kent Hammond is a 50 y.o. male.   HPI 50 year old male presents with left great toe pain with suspected warts or lesion x 1 week.  Reports has been using OTC wart removal product.  Past Medical History:  Diagnosis Date   Asthmatic bronchitis     Patient Active Problem List   Diagnosis Date Noted   Hyperplastic colonic polyp 02/05/2020   Elevated fasting glucose 02/05/2020   Sore throat 08/24/2019   Umbilical hernia without obstruction and without gangrene 09/19/2017   Asthma, chronic 06/17/2014   Pigmented skin lesion 06/17/2014   Seborrheic keratoses 06/17/2014    Past Surgical History:  Procedure Laterality Date   HERNIA REPAIR         Home Medications    Prior to Admission medications   Not on File    Family History Family History  Problem Relation Age of Onset   Cancer Father        brain cancer, unsure if complication from [place] War    Alcoholism Other    Heart attack Other        uncles   Hyperlipidemia Mother     Social History Social History   Tobacco Use   Smoking status: Former    Types: Cigarettes   Smokeless tobacco: Current    Types: Chew  Vaping Use   Vaping Use: Never used  Substance Use Topics   Alcohol use: Not Currently    Alcohol/week: 6.0 standard drinks    Types: 6 Standard drinks or equivalent per week    Comment: weekly   Drug use: No     Allergies   Other and Shellfish allergy   Review of Systems Review of Systems  Skin:        Possible wart of left great toe    Physical Exam Triage Vital Signs ED Triage Vitals  Enc Vitals Group     BP 11/24/20 0948 121/82     Pulse Rate 11/24/20 0948 98     Resp 11/24/20 0948 20     Temp 11/24/20 0948 98.3 F (36.8 C)     Temp Source 11/24/20 0948 Oral     SpO2 11/24/20 0948  97 %     Weight 11/24/20 0943 (!) 310 lb (140.6 kg)     Height 11/24/20 0943 6\' 6"  (1.981 m)     Head Circumference --      Peak Flow --      Pain Score 11/24/20 0942 2     Pain Loc --      Pain Edu? --      Excl. in GC? --    No data found.  Updated Vital Signs BP 121/82 (BP Location: Right Arm)   Pulse 98   Temp 98.3 F (36.8 C) (Oral)   Resp 20   Ht 6\' 6"  (1.981 m)   Wt (!) 310 lb (140.6 kg)   SpO2 97%   BMI 35.82 kg/m   Physical Exam Vitals and nursing note reviewed.  Constitutional:      General: He is not in acute distress.    Appearance: Normal appearance. He is normal weight. He is not ill-appearing.  HENT:     Head: Normocephalic and atraumatic.  Mouth/Throat:     Mouth: Mucous membranes are moist.     Pharynx: Oropharynx is clear.  Eyes:     Extraocular Movements: Extraocular movements intact.     Conjunctiva/sclera: Conjunctivae normal.     Pupils: Pupils are equal, round, and reactive to light.  Cardiovascular:     Rate and Rhythm: Normal rate and regular rhythm.     Pulses: Normal pulses.     Heart sounds: Normal heart sounds.  Pulmonary:     Effort: Pulmonary effort is normal.     Breath sounds: Normal breath sounds. No wheezing, rhonchi or rales.  Musculoskeletal:        General: Normal range of motion.     Cervical back: Normal range of motion and neck supple.  Skin:    General: Skin is warm and dry.     Comments: Left great toe (tip/volar aspect): 0.5 cm circular shaped white plaque covering possible plantar wart, patient has been using OTC wart removal medication daily x1 week  Neurological:     General: No focal deficit present.     Mental Status: He is alert and oriented to person, place, and time. Mental status is at baseline.  Psychiatric:        Mood and Affect: Mood normal.        Behavior: Behavior normal.        Thought Content: Thought content normal.     UC Treatments / Results  Labs (all labs ordered are listed, but only  abnormal results are displayed) Labs Reviewed - No data to display  EKG   Radiology No results found.  Procedures Procedures (including critical care time)  Medications Ordered in UC Medications - No data to display  Initial Impression / Assessment and Plan / UC Course  I have reviewed the triage vital signs and the nursing notes.  Pertinent labs & imaging results that were available during my care of the patient were reviewed by me and considered in my medical decision making (see chart for details).     MDM: 1.  Plantar wart of left great toe-exam reveals possible plantar wart of left great toe, due to OTC medication skin appearance is changed greatly, with circular shaped white plaque over possible plantar wart.  Patient advised to stop OTC medications/wart removal medications to evaluate skin of left toe without topical medication applied.  Patient discharged home, hemodynamically stable. Final Clinical Impressions(s) / UC Diagnoses   Final diagnoses:  Great toe pain, left  Plantar wart     Discharge Instructions      Advised/encouraged patient to discontinue OTC wart removal so that toe wound can be evaluated without change topography of left great toe lesion.     ED Prescriptions   None    PDMP not reviewed this encounter.   Trevor Iha, FNP 11/24/20 1258

## 2020-12-26 ENCOUNTER — Telehealth: Payer: Self-pay | Admitting: General Practice

## 2020-12-26 NOTE — Telephone Encounter (Signed)
Transition Care Management Unsuccessful Follow-up Telephone Call  Date of discharge and from where:  12/25/20 from [facility]  Attempts:  1st Attempt  Reason for unsuccessful TCM follow-up call:  Left voice message    

## 2020-12-29 NOTE — Telephone Encounter (Signed)
Transition Care Management Unsuccessful Follow-up Telephone Call  Date of discharge and from where:  12/25/20 from [facility]  Attempts:  2nd Attempt  Reason for unsuccessful TCM follow-up call:  Left voice message    

## 2020-12-31 NOTE — Telephone Encounter (Signed)
Transition Care Management Unsuccessful Follow-up Telephone Call  Date of discharge and from where:  12/25/20 from [facility]  Attempts:  3rd Attempt  Reason for unsuccessful TCM follow-up call:  Left voice message    

## 2021-02-09 ENCOUNTER — Telehealth: Payer: Self-pay | Admitting: General Practice

## 2021-02-09 NOTE — Telephone Encounter (Signed)
Transition Care Management Follow-up Telephone Call Date of discharge and from where: 02/08/21 from [facility] How have you been since you were released from the hospital? Doing ok. Waiting to hear back from his podiatrist.  Any questions or concerns? No  Items Reviewed: Did the pt receive and understand the discharge instructions provided? Yes  Medications obtained and verified? Yes  Other? No  Any new allergies since your discharge? No  Dietary orders reviewed? Yes Do you have support at home? Yes   Home Care and Equipment/Supplies: Were home health services ordered? no  Functional Questionnaire: (I = Independent and D = Dependent) ADLs: I  Bathing/Dressing- I  Meal Prep- I  Eating- I  Maintaining continence- I  Transferring/Ambulation- I  Managing Meds- I  Follow up appointments reviewed:  PCP Hospital f/u appt confirmed?  Patient will call back to schedule.   Patient is waiting to hear back from Podiatrist. Advised patient to call us if he hasn't heard back from them. Specialist Hospital f/u appt confirmed? No  Patient is waiting to hear back from Podiatrist. Advised patient to call us if he hasn't heard back from them. Are transportation arrangements needed? No  If their condition worsens, is the pt aware to call PCP or go to the Emergency Dept.? Yes Was the patient provided with contact information for the PCP's office or ED? Yes Was to pt encouraged to call back with questions or concerns? Yes

## 2021-04-03 ENCOUNTER — Ambulatory Visit: Payer: BC Managed Care – PPO | Admitting: Medical-Surgical

## 2021-08-15 ENCOUNTER — Encounter: Payer: Self-pay | Admitting: Emergency Medicine

## 2021-08-15 ENCOUNTER — Emergency Department (INDEPENDENT_AMBULATORY_CARE_PROVIDER_SITE_OTHER)
Admission: EM | Admit: 2021-08-15 | Discharge: 2021-08-15 | Disposition: A | Discharge status: Home / Self Care | Payer: BC Managed Care – PPO | Source: Home / Self Care

## 2021-08-15 ENCOUNTER — Ambulatory Visit (HOSPITAL_COMMUNITY)
Admission: EM | Admit: 2021-08-15 | Discharge: 2021-08-15 | Discharge status: Absent without leave | Payer: BC Managed Care – PPO

## 2021-08-15 DIAGNOSIS — R030 Elevated blood-pressure reading, without diagnosis of hypertension: Secondary | ICD-10-CM | POA: Diagnosis not present

## 2021-08-15 DIAGNOSIS — L237 Allergic contact dermatitis due to plants, except food: Secondary | ICD-10-CM | POA: Diagnosis not present

## 2021-08-15 MED ORDER — METHYLPREDNISOLONE ACETATE 80 MG/ML IJ SUSP
80.0000 mg | Freq: Once | INTRAMUSCULAR | Status: AC
  Administered 2021-08-15: 80 mg via INTRAMUSCULAR

## 2021-08-15 NOTE — Discharge Instructions (Signed)
Take antihistamines for the itching ?One or two zyrtec in the morning ?One or two benadryl at night ?May continue calamine ?

## 2021-08-15 NOTE — ED Triage Notes (Signed)
Rash to bilateral legs x 5 days after weed whacking the yard  ?Calamine lotion - min relief ?No allergy meds  ?Itches per pt   ?

## 2021-08-15 NOTE — ED Provider Notes (Signed)
?[facility] ? ? ? ?CSN: [id number] ?Arrival date & time: 08/15/21  0813 ? ? ?  ? ?History   ?Chief Complaint ?Chief Complaint  ?Patient presents with  ? Rash  ? ? ?HPI ?Kent Hammond is a 51 y.o. male.  ? ?HPI ?Patient is here for what he believes is poison ivy.  He used a weed Wacker to clean up weeds in the yard while wearing shorts.  He now has scattered rash on both lower legs.  It is itching terribly.  Weeping.  Some swelling behind his knee on the right ?He is otherwise well ? ?Past Medical History:  ?Diagnosis Date  ? Asthmatic bronchitis   ? ? ?Patient Active Problem List  ? Diagnosis Date Noted  ? Hyperplastic colonic polyp 02/05/2020  ? Elevated fasting glucose 02/05/2020  ? Sore throat 08/24/2019  ? Umbilical hernia without obstruction and without gangrene 09/19/2017  ? Asthma, chronic 06/17/2014  ? Pigmented skin lesion 06/17/2014  ? Seborrheic keratoses 06/17/2014  ? ? ?Past Surgical History:  ?Procedure Laterality Date  ? HERNIA REPAIR    ? ? ? ? ? ?Home Medications   ? ?Prior to Admission medications   ?Not on File  ? ? ?Family History ?Family History  ?Problem Relation Age of Onset  ? Cancer Father   ?     brain cancer, unsure if complication from [place] War   ? Alcoholism Other   ? Heart attack Other   ?     uncles  ? Hyperlipidemia Mother   ? ? ?Social History ?Social History  ? ?Tobacco Use  ? Smoking status: Former  ?  Types: Cigarettes  ? Smokeless tobacco: Current  ?  Types: Chew  ?Vaping Use  ? Vaping Use: Never used  ?Substance Use Topics  ? Alcohol use: Not Currently  ?  Alcohol/week: 6.0 standard drinks  ?  Types: 6 Standard drinks or equivalent per week  ?  Comment: weekly  ? Drug use: No  ? ? ? ?Allergies   ?Shellfish allergy ? ? ?Review of Systems ?Review of Systems ?See HPI ? ?Physical Exam ?Triage Vital Signs ?ED Triage Vitals  ?Enc Vitals Group  ?   BP 08/15/21 0823 (!) 147/90  ?   Pulse Rate 08/15/21 0823 64  ?   Resp 08/15/21 0823 17  ?   Temp 08/15/21 0823 98.5 ?F  (36.9 ?C)  ?   Temp Source 08/15/21 0823 Oral  ?   SpO2 08/15/21 0823 96 %  ?   Weight 08/15/21 0825 (!) 325 lb (147.4 kg)  ?   Height 08/15/21 0825 6\' 6"  (1.981 m)  ?   Head Circumference --   ?   Peak Flow --   ?   Pain Score 08/15/21 0825 0  ?   Pain Loc --   ?   Pain Edu? --   ?   Excl. in GC? --   ? ?No data found. ? ?Updated Vital Signs ?BP (!) 147/90 (BP Location: Left Arm)   Pulse 64   Temp 98.5 ?F (36.9 ?C) (Oral)   Resp 17   Ht 6\' 6"  (1.981 m)   Wt (!) 147.4 kg   SpO2 96%   BMI 37.56 kg/m?  ? ?    ? ?Physical Exam ?Constitutional:   ?   General: He is not in acute distress. ?   Appearance: He is well-developed.  ?HENT:  ?   Head: Normocephalic and atraumatic.  ?Eyes:  ?  Conjunctiva/sclera: Conjunctivae normal.  ?   Pupils: Pupils are equal, round, and reactive to light.  ?Cardiovascular:  ?   Rate and Rhythm: Normal rate.  ?Pulmonary:  ?   Effort: Pulmonary effort is normal. No respiratory distress.  ?Abdominal:  ?   General: There is no distension.  ?   Palpations: Abdomen is soft.  ?Musculoskeletal:     ?   General: Normal range of motion.  ?   Cervical back: Normal range of motion.  ?Skin: ?   General: Skin is warm and dry.  ?   Findings: No rash.  ?   Comments: Scattered patches of vesicular lesions on both legs more posterior, all at knee level and below.  ?Neurological:  ?   Mental Status: He is alert.  ?Psychiatric:     ?   Mood and Affect: Mood normal.     ?   Behavior: Behavior normal.  ? ? ? ?UC Treatments / Results  ?Labs ?(all labs ordered are listed, but only abnormal results are displayed) ?Labs Reviewed - No data to display ? ?EKG ? ? ?Radiology ?No results found. ? ?Procedures ?Procedures (including critical care time) ? ?Medications Ordered in UC ?Medications  ?methylPREDNISolone acetate (DEPO-MEDROL) injection 80 mg (has no administration in time range)  ? ? ?Initial Impression / Assessment and Plan / UC Course  ?I have reviewed the triage vital signs and the nursing  notes. ? ?Pertinent labs & imaging results that were available during my care of the patient were reviewed by me and considered in my medical decision making (see chart for details). ? ?  ? ?Discussed treatment with cortisone cream and antihistamines.  Patient prefers an injection.  He has had good luck with this before.  This is provided for him ?We also discussed his blood pressure.  He states is always elevated when he goes the doctor's office.  He has discussed this with his primary care doctor.  He is not on medications. ?Final Clinical Impressions(s) / UC Diagnoses  ? ?Final diagnoses:  ?Allergic contact dermatitis due to plants, except food  ? ? ? ?Discharge Instructions   ? ?  ?Take antihistamines for the itching ?One or two zyrtec in the morning ?One or two benadryl at night ?May continue calamine ? ? ?ED Prescriptions   ?None ?  ? ?PDMP not reviewed this encounter. ?  ?Eustace Moore, MD ?08/15/21 [id number] ? ?

## 2022-05-06 ENCOUNTER — Ambulatory Visit
Admission: EM | Admit: 2022-05-06 | Discharge: 2022-05-06 | Disposition: A | Discharge status: Home / Self Care | Payer: BC Managed Care – PPO

## 2022-05-06 DIAGNOSIS — U071 COVID-19: Secondary | ICD-10-CM

## 2022-05-06 LAB — POC SARS CORONAVIRUS 2 AG -  ED: SARS Coronavirus 2 Ag: POSITIVE — AB

## 2022-05-06 MED ORDER — MOLNUPIRAVIR EUA 200MG CAPSULE: 4.0000 | ORAL_CAPSULE | Freq: Two times a day (BID) | ORAL | 0 refills | Status: AC

## 2022-05-06 NOTE — ED Provider Notes (Signed)
[facility]    CSN: [id number] Arrival date & time: 05/06/22  0815      History   Chief Complaint Chief Complaint  Patient presents with   Cough   Nasal Congestion    HPI Kent Hammond is a 52 y.o. male.   52 year old male presents today with a 2-day history of cough and nasal congestion.  States he was at a party on Sunday around a lot of people.  Denies any known sick contacts.  By Monday evening, patient states he felt very tired.  Tuesday, he continued with fatigue and started developing sinus congestion.  Reports rhinorrhea and postnasal drainage.  Patient also develops a dry cough over the past day.  Took some over-the-counter medication without relief. Reports hx of asthmatic bronchitis. No current SOB, wheezing or CP. No known fever.   Cough   Past Medical History:  Diagnosis Date   Asthmatic bronchitis     Patient Active Problem List   Diagnosis Date Noted   Hyperplastic colonic polyp 02/05/2020   Elevated fasting glucose 02/05/2020   Sore throat 92/33/0076   Umbilical hernia without obstruction and without gangrene 09/19/2017   Asthma, chronic 06/17/2014   Pigmented skin lesion 06/17/2014   Seborrheic keratoses 06/17/2014    Past Surgical History:  Procedure Laterality Date   HERNIA REPAIR         Home Medications    Prior to Admission medications   Medication Sig Start Date End Date Taking? Authorizing Provider  DM-Doxylamine-Acetaminophen (NYQUIL COLD & FLU PO) Take by mouth.   Yes [provider]  molnupiravir EUA (LAGEVRIO) 200 mg CAPS capsule Take 4 capsules (800 mg total) by mouth 2 (two) times daily for 5 days. 05/06/22 05/11/22 Yes Remie Mathison, Sherren Kerns, PA    Family History Family History  Problem Relation Age of Onset   Cancer Father        brain cancer, unsure if complication from [place] Other    Heart attack Other        uncles   Hyperlipidemia Mother     Social History Social History    Tobacco Use   Smoking status: Former    Types: Cigarettes   Smokeless tobacco: Current    Types: Secondary school teacher   Vaping Use: Never used  Substance Use Topics   Alcohol use: Not Currently    Comment: occasionally   Drug use: No     Allergies   Shellfish allergy   Review of Systems Review of Systems  Respiratory:  Positive for cough.   As per HPI   Physical Exam Triage Vital Signs ED Triage Vitals  Enc Vitals Group     BP 05/06/22 0829 139/88     Pulse Rate 05/06/22 0829 75     Resp 05/06/22 0829 20     Temp 05/06/22 0829 98.2 F (36.8 C)     Temp Source 05/06/22 0829 Oral     SpO2 05/06/22 0829 97 %     Weight 05/06/22 0826 (!) 325 lb (147.4 kg)     Height 05/06/22 0826 6\' 6"  (1.981 m)     Head Circumference --      Peak Flow --      Pain Score 05/06/22 0824 0     Pain Loc --      Pain Edu? --      Excl. in [place]? --    No data found.  Updated Vital Signs BP 139/88 (BP  Location: Left Arm)   Pulse 75   Temp 98.2 F (36.8 C) (Oral)   Resp 20   Ht 6\' 6"  (1.981 m)   Wt (!) 325 lb (147.4 kg)   SpO2 97%   BMI 37.56 kg/m   Visual Acuity Right Eye Distance:   Left Eye Distance:   Bilateral Distance:    Right Eye Near:   Left Eye Near:    Bilateral Near:     Physical Exam Vitals and nursing note reviewed. Exam conducted with a chaperone present.  Constitutional:      General: He is not in acute distress.    Appearance: He is obese. He is not ill-appearing or toxic-appearing.  HENT:     Head: Normocephalic and atraumatic.     Right Ear: Tympanic membrane, ear canal and external ear normal. No drainage, swelling or tenderness. No middle ear effusion. There is no impacted cerumen. Tympanic membrane is not erythematous.     Left Ear: Tympanic membrane, ear canal and external ear normal. No drainage, swelling or tenderness.  No middle ear effusion. There is no impacted cerumen. Tympanic membrane is not erythematous.     Nose: Rhinorrhea present. No  congestion.     Mouth/Throat:     Mouth: Mucous membranes are moist. No oral lesions.     Pharynx: Oropharynx is clear. No pharyngeal swelling, oropharyngeal exudate, posterior oropharyngeal erythema or uvula swelling.     Tonsils: No tonsillar exudate or tonsillar abscesses.  Eyes:     General: No scleral icterus.       Right eye: No discharge.        Left eye: No discharge.     Extraocular Movements: Extraocular movements intact.     Left eye: Normal extraocular motion.     Conjunctiva/sclera: Conjunctivae normal.     Pupils: Pupils are equal, round, and reactive to light.  Neck:     Thyroid: No thyromegaly.  Cardiovascular:     Rate and Rhythm: Normal rate and regular rhythm.     Heart sounds: Normal heart sounds. No murmur heard.    No friction rub. No gallop.  Pulmonary:     Effort: Pulmonary effort is normal. No respiratory distress.     Breath sounds: Normal breath sounds. No stridor. No wheezing, rhonchi or rales.  Chest:     Chest wall: No tenderness.  Abdominal:     Palpations: Abdomen is soft.  Musculoskeletal:     Cervical back: Normal range of motion and neck supple. No rigidity or tenderness.  Lymphadenopathy:     Cervical: No cervical adenopathy.  Skin:    General: Skin is warm.     Capillary Refill: Capillary refill takes less than 2 seconds.     Coloration: Skin is not pale.     Findings: No erythema or rash.  Neurological:     General: No focal deficit present.     Mental Status: He is alert.  Psychiatric:        Mood and Affect: Mood normal.        Behavior: Behavior normal.      UC Treatments / Results  Labs (all labs ordered are listed, but only abnormal results are displayed) Labs Reviewed  POC SARS CORONAVIRUS 2 AG -  ED - Abnormal; Notable for the following components:      Result Value   SARS Coronavirus 2 Ag Positive (*)    All other components within normal limits    EKG   Radiology No  results found.  Procedures Procedures  (including critical care time)  Medications Ordered in UC Medications - No data to display  Initial Impression / Assessment and Plan / UC Course  I have reviewed the triage vital signs and the nursing notes.  Pertinent labs & imaging results that were available during my care of the patient were reviewed by me and considered in my medical decision making (see chart for details).     Covid-19 - lungs CTA, VSS. Pt with pulmonary hx. Additional risk factors include BMI. Discussed antiviral therapy, pt within treatment window. No known hx of CKD, however no recent labs. Pt opting to try molnupiravir. Discussed uses, indications, benefits, risks and side effects. Handouts provided. Pt may also use OTC flonase or saline for nasal congestion. ER precautions reveiwed   Final Clinical Impressions(s) / UC Diagnoses   Final diagnoses:  [id number]     Discharge Instructions      You are positive for covid. Start molnupiravir today; take 4 caps twice daily for 5 days. Monitor for adverse reactions. If these occur, you may stop the medication. We cannot prescribe paxlovid as we do not have any recent renal function labs to review. Please read the attached handouts regarding Covid 19 and the [place].  Rest and stay hydrated with water. Must quarantine x 5 days total, wear N95 mask when out for 5 days after isolation period is completed. Please monitor regression of your symptoms. Some people have tried OTC Quercetin to help fight off illness. If any new or worsening symptoms develops, particularly uncontrollable fever, severe shortness of breath or chest pain, please head to the ER.      ED Prescriptions     Medication Sig Dispense Auth. Provider   molnupiravir EUA (LAGEVRIO) 200 mg CAPS capsule Take 4 capsules (800 mg total) by mouth 2 (two) times daily for 5 days. 40 capsule Frenchie Pribyl L, PA      PDMP not reviewed this encounter.   Chaney Malling, [place] 05/06/22 1003

## 2022-05-06 NOTE — ED Triage Notes (Addendum)
Pt presents to Urgent Care with c/o nasal congestion, "chest tightness," and cough x 4 days. Afebrile. Has not done COVID test. Also c/o lightheadedness earlier this AM.

## 2022-05-06 NOTE — Discharge Instructions (Addendum)
You are positive for covid. Start molnupiravir today; take 4 caps twice daily for 5 days. Monitor for adverse reactions. If these occur, you may stop the medication. We cannot prescribe paxlovid as we do not have any recent renal function labs to review. Please read the attached handouts regarding Covid 19 and the [place].  Rest and stay hydrated with water. Must quarantine x 5 days total, wear N95 mask when out for 5 days after isolation period is completed. Please monitor regression of your symptoms. Some people have tried OTC Quercetin to help fight off illness. If any new or worsening symptoms develops, particularly uncontrollable fever, severe shortness of breath or chest pain, please head to the ER.
